# Patient Record
Sex: Female | Born: 1970 | Race: Black or African American | Hispanic: No | Marital: Single | Smoking: Former smoker
Health system: Southern US, Community
[De-identification: ages and names within clinical notes are randomized; demographics above are authoritative.]

## PROBLEM LIST (undated history)

## (undated) DIAGNOSIS — I10 Essential (primary) hypertension: Secondary | ICD-10-CM

## (undated) DIAGNOSIS — N760 Acute vaginitis: Secondary | ICD-10-CM

## (undated) DIAGNOSIS — B9689 Other specified bacterial agents as the cause of diseases classified elsewhere: Secondary | ICD-10-CM

## (undated) DIAGNOSIS — R739 Hyperglycemia, unspecified: Secondary | ICD-10-CM

## (undated) DIAGNOSIS — A6000 Herpesviral infection of urogenital system, unspecified: Secondary | ICD-10-CM

## (undated) HISTORY — DX: Herpesviral infection of urogenital system, unspecified: A60.00

## (undated) HISTORY — PX: TUBAL LIGATION: SHX77

## (undated) HISTORY — DX: Hyperglycemia, unspecified: R73.9

## (undated) HISTORY — DX: Essential (primary) hypertension: I10

---

## 2003-08-05 ENCOUNTER — Other Ambulatory Visit: Admission: RE | Admit: 2003-08-05 | Discharge: 2003-08-05 | Payer: Self-pay | Admitting: Family Medicine

## 2004-02-19 ENCOUNTER — Encounter: Admission: RE | Admit: 2004-02-19 | Discharge: 2004-02-19 | Payer: Self-pay | Admitting: *Deleted

## 2004-03-05 ENCOUNTER — Ambulatory Visit (HOSPITAL_COMMUNITY): Admission: RE | Admit: 2004-03-05 | Discharge: 2004-03-05 | Payer: Self-pay | Admitting: *Deleted

## 2004-04-26 ENCOUNTER — Emergency Department (HOSPITAL_COMMUNITY): Admission: EM | Admit: 2004-04-26 | Discharge: 2004-04-26 | Payer: Self-pay | Admitting: Emergency Medicine

## 2005-09-11 ENCOUNTER — Emergency Department (HOSPITAL_COMMUNITY): Admission: EM | Admit: 2005-09-11 | Discharge: 2005-09-11 | Payer: Self-pay | Admitting: Emergency Medicine

## 2007-06-09 ENCOUNTER — Emergency Department (HOSPITAL_COMMUNITY): Admission: EM | Admit: 2007-06-09 | Discharge: 2007-06-09 | Payer: Self-pay | Admitting: Emergency Medicine

## 2008-05-22 ENCOUNTER — Emergency Department (HOSPITAL_COMMUNITY): Admission: EM | Admit: 2008-05-22 | Discharge: 2008-05-22 | Payer: Self-pay | Admitting: *Deleted

## 2009-03-14 ENCOUNTER — Emergency Department (HOSPITAL_BASED_OUTPATIENT_CLINIC_OR_DEPARTMENT_OTHER): Admission: EM | Admit: 2009-03-14 | Discharge: 2009-03-14 | Payer: Self-pay | Admitting: Emergency Medicine

## 2009-08-30 ENCOUNTER — Emergency Department (HOSPITAL_BASED_OUTPATIENT_CLINIC_OR_DEPARTMENT_OTHER): Admission: EM | Admit: 2009-08-30 | Discharge: 2009-08-30 | Payer: Self-pay | Admitting: Emergency Medicine

## 2010-02-27 ENCOUNTER — Emergency Department (HOSPITAL_COMMUNITY)
Admission: EM | Admit: 2010-02-27 | Discharge: 2010-02-27 | Payer: Self-pay | Source: Home / Self Care | Admitting: Emergency Medicine

## 2010-04-21 ENCOUNTER — Ambulatory Visit (INDEPENDENT_AMBULATORY_CARE_PROVIDER_SITE_OTHER): Payer: 59 | Admitting: Gynecology

## 2010-04-21 ENCOUNTER — Other Ambulatory Visit: Payer: Self-pay | Admitting: Gynecology

## 2010-04-21 ENCOUNTER — Other Ambulatory Visit (HOSPITAL_COMMUNITY)
Admission: RE | Admit: 2010-04-21 | Discharge: 2010-04-21 | Disposition: A | Discharge status: Home / Self Care | Payer: 59 | Source: Ambulatory Visit | Attending: Gynecology | Admitting: Gynecology

## 2010-04-21 DIAGNOSIS — Z124 Encounter for screening for malignant neoplasm of cervix: Secondary | ICD-10-CM | POA: Insufficient documentation

## 2010-04-21 DIAGNOSIS — B373 Candidiasis of vulva and vagina: Secondary | ICD-10-CM

## 2010-04-21 DIAGNOSIS — Z01419 Encounter for gynecological examination (general) (routine) without abnormal findings: Secondary | ICD-10-CM

## 2010-04-21 DIAGNOSIS — Z113 Encounter for screening for infections with a predominantly sexual mode of transmission: Secondary | ICD-10-CM

## 2010-04-21 DIAGNOSIS — Z13220 Encounter for screening for lipoid disorders: Secondary | ICD-10-CM

## 2010-04-21 DIAGNOSIS — R82998 Other abnormal findings in urine: Secondary | ICD-10-CM

## 2010-04-21 DIAGNOSIS — Z833 Family history of diabetes mellitus: Secondary | ICD-10-CM

## 2010-04-21 DIAGNOSIS — R635 Abnormal weight gain: Secondary | ICD-10-CM

## 2010-05-17 LAB — URINALYSIS, ROUTINE W REFLEX MICROSCOPIC
Bilirubin Urine: NEGATIVE
Glucose, UA: NEGATIVE mg/dL
Ketones, ur: NEGATIVE mg/dL
Nitrite: NEGATIVE
Protein, ur: NEGATIVE mg/dL
Specific Gravity, Urine: 1.019 (ref 1.005–1.030)
Urobilinogen, UA: 1 mg/dL (ref 0.0–1.0)
pH: 6 (ref 5.0–8.0)

## 2010-05-17 LAB — GC/CHLAMYDIA PROBE AMP, GENITAL
Chlamydia, DNA Probe: NEGATIVE
GC Probe Amp, Genital: NEGATIVE

## 2010-05-17 LAB — WET PREP, GENITAL
Trich, Wet Prep: NONE SEEN
Yeast Wet Prep HPF POC: NONE SEEN

## 2010-05-17 LAB — URINE MICROSCOPIC-ADD ON

## 2010-05-17 LAB — POCT PREGNANCY, URINE: Preg Test, Ur: NEGATIVE

## 2010-05-23 LAB — URINALYSIS, ROUTINE W REFLEX MICROSCOPIC
Bilirubin Urine: NEGATIVE
Bilirubin Urine: NEGATIVE
Glucose, UA: NEGATIVE mg/dL
Glucose, UA: NEGATIVE mg/dL
Ketones, ur: NEGATIVE mg/dL
Ketones, ur: NEGATIVE mg/dL
Nitrite: NEGATIVE
Nitrite: NEGATIVE
Protein, ur: 30 mg/dL — AB
Protein, ur: NEGATIVE mg/dL
Specific Gravity, Urine: 1.022 (ref 1.005–1.030)
Specific Gravity, Urine: 1.023 (ref 1.005–1.030)
Urobilinogen, UA: 1 mg/dL (ref 0.0–1.0)
Urobilinogen, UA: 0.2 mg/dL (ref 0.0–1.0)
pH: 6.5 (ref 5.0–8.0)
pH: 6 (ref 5.0–8.0)

## 2010-05-23 LAB — PREGNANCY, URINE
Preg Test, Ur: NEGATIVE
Preg Test, Ur: NEGATIVE

## 2010-05-23 LAB — URINE CULTURE: Colony Count: 30000

## 2010-05-23 LAB — URINE MICROSCOPIC-ADD ON

## 2010-05-23 LAB — WET PREP, GENITAL
Trich, Wet Prep: NONE SEEN
Yeast Wet Prep HPF POC: NONE SEEN

## 2010-05-23 LAB — GC/CHLAMYDIA PROBE AMP, GENITAL
Chlamydia, DNA Probe: NEGATIVE
GC Probe Amp, Genital: NEGATIVE

## 2010-06-17 LAB — COMPREHENSIVE METABOLIC PANEL
ALT: 19 U/L (ref 0–35)
AST: 20 U/L (ref 0–37)
Albumin: 3.4 g/dL — ABNORMAL LOW (ref 3.5–5.2)
Alkaline Phosphatase: 64 U/L (ref 39–117)
BUN: 7 mg/dL (ref 6–23)
CO2: 25 mEq/L (ref 19–32)
Calcium: 8.6 mg/dL (ref 8.4–10.5)
Chloride: 109 mEq/L (ref 96–112)
Creatinine, Ser: 0.66 mg/dL (ref 0.4–1.2)
GFR calc Af Amer: >60 mL/min (ref >60)
GFR calc non Af Amer: >60 mL/min (ref >60)
Glucose, Bld: 95 mg/dL (ref 70–99)
Potassium: 3.9 mEq/L (ref 3.5–5.1)
Sodium: 138 mEq/L (ref 135–145)
Total Bilirubin: 0.4 mg/dL (ref 0.3–1.2)
Total Protein: 5.8 g/dL — ABNORMAL LOW (ref 6.0–8.3)

## 2010-06-17 LAB — WET PREP, GENITAL
Trich, Wet Prep: NONE SEEN
Yeast Wet Prep HPF POC: NONE SEEN

## 2010-06-17 LAB — DIFFERENTIAL
Basophils Absolute: 0 10*3/uL (ref 0.0–0.1)
Basophils Relative: 1 % (ref 0–1)
Eosinophils Absolute: 0.1 10*3/uL (ref 0.0–0.7)
Eosinophils Relative: 3 % (ref 0–5)
Lymphocytes Relative: 30 % (ref 12–46)
Lymphs Abs: 1.7 10*3/uL (ref 0.7–4.0)
Monocytes Absolute: 0.6 10*3/uL (ref 0.1–1.0)
Monocytes Relative: 11 % (ref 3–12)
Neutro Abs: 3.1 10*3/uL (ref 1.7–7.7)
Neutrophils Relative %: 56 % (ref 43–77)

## 2010-06-17 LAB — CBC
HCT: 36.7 % (ref 36.0–46.0)
Hemoglobin: 12.1 g/dL (ref 12.0–15.0)
MCHC: 32.9 g/dL (ref 30.0–36.0)
MCV: 85.7 fL (ref 78.0–100.0)
Platelets: 267 10*3/uL (ref 150–400)
RBC: 4.28 MIL/uL (ref 3.87–5.11)
RDW: 12.9 % (ref 11.5–15.5)
WBC: 5.5 10*3/uL (ref 4.0–10.5)

## 2010-06-17 LAB — URINALYSIS, ROUTINE W REFLEX MICROSCOPIC
Bilirubin Urine: NEGATIVE
Nitrite: NEGATIVE
Specific Gravity, Urine: 1.018 (ref 1.005–1.030)
Urobilinogen, UA: 1 mg/dL (ref 0.0–1.0)
pH: 6 (ref 5.0–8.0)

## 2010-06-17 LAB — GC/CHLAMYDIA PROBE AMP, GENITAL
Chlamydia, DNA Probe: NEGATIVE
GC Probe Amp, Genital: NEGATIVE

## 2010-06-17 LAB — URINE CULTURE

## 2010-06-17 LAB — POCT I-STAT, CHEM 8
BUN: 7 mg/dL (ref 6–23)
Calcium, Ion: 1.16 mmol/L (ref 1.12–1.32)
Chloride: 106 mEq/L (ref 96–112)
Creatinine, Ser: 0.7 mg/dL (ref 0.4–1.2)
Sodium: 141 mEq/L (ref 135–145)

## 2010-06-17 LAB — LIPASE, BLOOD: Lipase: 23 U/L (ref 11–59)

## 2010-06-17 LAB — POCT PREGNANCY, URINE: Preg Test, Ur: NEGATIVE

## 2010-07-07 ENCOUNTER — Institutional Professional Consult (permissible substitution): Payer: 59 | Admitting: Gynecology

## 2010-11-09 ENCOUNTER — Emergency Department (HOSPITAL_COMMUNITY): Payer: 59

## 2010-11-09 ENCOUNTER — Emergency Department (HOSPITAL_COMMUNITY)
Admission: EM | Admit: 2010-11-09 | Discharge: 2010-11-09 | Disposition: A | Discharge status: Home / Self Care | Payer: 59 | Attending: Emergency Medicine | Admitting: Emergency Medicine

## 2010-11-09 DIAGNOSIS — R1031 Right lower quadrant pain: Secondary | ICD-10-CM | POA: Insufficient documentation

## 2010-11-09 LAB — URINALYSIS, ROUTINE W REFLEX MICROSCOPIC
Protein, ur: NEGATIVE mg/dL
Urobilinogen, UA: 0.2 mg/dL (ref 0.0–1.0)

## 2010-11-09 LAB — CBC
MCH: 27.8 pg (ref 26.0–34.0)
MCHC: 32.8 g/dL (ref 30.0–36.0)
Platelets: 321 10*3/uL (ref 150–400)
RDW: 13 % (ref 11.5–15.5)

## 2010-11-09 LAB — DIFFERENTIAL
Basophils Relative: 0 % (ref 0–1)
Eosinophils Absolute: 0.2 10*3/uL (ref 0.0–0.7)
Eosinophils Relative: 2 % (ref 0–5)
Monocytes Absolute: 0.8 10*3/uL (ref 0.1–1.0)
Monocytes Relative: 9 % (ref 3–12)

## 2010-11-09 LAB — BASIC METABOLIC PANEL
Calcium: 9.1 mg/dL (ref 8.4–10.5)
GFR calc Af Amer: >60 mL/min (ref >60)
GFR calc non Af Amer: >60 mL/min (ref >60)
Glucose, Bld: 91 mg/dL (ref 70–99)
Sodium: 139 mEq/L (ref 135–145)

## 2010-11-09 LAB — URINE MICROSCOPIC-ADD ON

## 2010-11-09 MED ORDER — IOHEXOL 300 MG/ML  SOLN
100.0000 mL | Freq: Once | INTRAMUSCULAR | Status: AC | PRN
  Administered 2010-11-09: 100 mL via INTRAVENOUS

## 2010-11-10 LAB — RPR: RPR Ser Ql: NONREACTIVE

## 2010-11-22 ENCOUNTER — Encounter: Payer: Self-pay | Admitting: Anesthesiology

## 2010-11-23 ENCOUNTER — Encounter: Payer: Self-pay | Admitting: Gynecology

## 2010-11-23 ENCOUNTER — Ambulatory Visit (INDEPENDENT_AMBULATORY_CARE_PROVIDER_SITE_OTHER): Payer: 59 | Admitting: Gynecology

## 2010-11-23 VITALS — BP 122/82

## 2010-11-23 DIAGNOSIS — Z309 Encounter for contraceptive management, unspecified: Secondary | ICD-10-CM

## 2010-11-23 MED ORDER — MEDROXYPROGESTERONE ACETATE 150 MG/ML IM SUSP
150.0000 mg | Freq: Once | INTRAMUSCULAR | Status: AC
  Administered 2010-11-23: 150 mg via INTRAMUSCULAR

## 2010-11-23 NOTE — Progress Notes (Signed)
Patient a 40 year old gravida 3 para 2 AB 1 who presented to the office today to discuss about going back on the Depo-Provera injectable contraception. Review of her records indicated she was seen in the office on 04/21/2010 for her annual gynecological examination. She has a history of postpartum tubal ligation. Reason for her to go on the Depo-Provera so she will not have a period to cut down her bleeding and cramping. Patient is fully aware of the risks benefits and pros and cons of the Depo-Provera injectable contraception and 150 mg IM will be administered today and she'll return in 3 months as she was continuous her treatment regimen and is important that she consider taking calcium with vitamin D for osteoporosis prevention.

## 2010-11-30 LAB — URINALYSIS, ROUTINE W REFLEX MICROSCOPIC
Glucose, UA: NEGATIVE
Protein, ur: 30 — AB
Urobilinogen, UA: 0.2

## 2010-11-30 LAB — URINE MICROSCOPIC-ADD ON

## 2011-02-11 ENCOUNTER — Ambulatory Visit (INDEPENDENT_AMBULATORY_CARE_PROVIDER_SITE_OTHER): Payer: 59 | Admitting: *Deleted

## 2011-02-11 DIAGNOSIS — Z3049 Encounter for surveillance of other contraceptives: Secondary | ICD-10-CM

## 2011-02-11 MED ORDER — MEDROXYPROGESTERONE ACETATE 150 MG/ML IM SUSP
150.0000 mg | Freq: Once | INTRAMUSCULAR | Status: AC
  Administered 2011-02-11: 150 mg via INTRAMUSCULAR

## 2011-04-08 ENCOUNTER — Encounter (HOSPITAL_COMMUNITY): Payer: Self-pay | Admitting: Family Medicine

## 2011-04-08 ENCOUNTER — Emergency Department (HOSPITAL_COMMUNITY)
Admission: EM | Admit: 2011-04-08 | Discharge: 2011-04-08 | Disposition: A | Discharge status: Home / Self Care | Payer: 59 | Attending: Emergency Medicine | Admitting: Emergency Medicine

## 2011-04-08 DIAGNOSIS — H109 Unspecified conjunctivitis: Secondary | ICD-10-CM

## 2011-04-08 DIAGNOSIS — H5789 Other specified disorders of eye and adnexa: Secondary | ICD-10-CM | POA: Insufficient documentation

## 2011-04-08 MED ORDER — TETRACAINE HCL 0.5 % OP SOLN
2.0000 [drp] | Freq: Once | OPHTHALMIC | Status: AC
  Administered 2011-04-08: 2 [drp] via OPHTHALMIC
  Filled 2011-04-08: qty 2

## 2011-04-08 MED ORDER — FLUORESCEIN SODIUM 1 MG OP STRP
2.0000 | ORAL_STRIP | Freq: Once | OPHTHALMIC | Status: AC
  Administered 2011-04-08: 2 via OPHTHALMIC
  Filled 2011-04-08: qty 1

## 2011-04-08 MED ORDER — TOBRAMYCIN 0.3 % OP SOLN
2.0000 [drp] | OPHTHALMIC | Status: DC
  Administered 2011-04-08: 2 [drp] via OPHTHALMIC
  Filled 2011-04-08: qty 5

## 2011-04-08 NOTE — ED Provider Notes (Signed)
History     CSN: [id number]  Arrival date & time 04/08/11  0759   First MD Initiated Contact with Patient 04/08/11 0803      8:19 AM HPI Patient reports waking up with left eye mucous-like  and redness. Denies change in vision, photophobia, yellowish or green discharge matting eye together. Denies fever, headache, neck pain, eye injury or trauma.  Patient is a 41 y.o. female presenting with eye problem. The history is provided by the patient.  Eye Problem  This is a new problem. The problem occurs constantly. The problem has not changed since onset.There is pain in the left eye. The injury mechanism is unknown. The patient is experiencing no pain. There is no history of trauma to the eye. Associated symptoms include discharge and eye redness. Pertinent negatives include no blurred vision, no decreased vision, no double vision, no foreign body sensation, no photophobia, no nausea, no vomiting and no itching. She has tried eye drops for the symptoms. The treatment provided mild relief.    History reviewed. No pertinent past medical history.  Past Surgical History  Procedure Date  . Tubal ligation     PPTL    Family History  Problem Relation Age of Onset  . Hypertension Mother     History  Substance Use Topics  . Smoking status: Current Everyday Smoker    Types: Cigarettes  . Smokeless tobacco: Never Used  . Alcohol Use: No    OB History    Grav Para Term Preterm Abortions TAB SAB Ect Mult Living   3 2   1  1   2       Review of Systems  Constitutional: Negative for fever and chills.  HENT: Negative for sore throat and sneezing.   Eyes: Positive for discharge and redness. Negative for blurred vision, double vision, photophobia, pain, itching and visual disturbance.  Respiratory: Negative for cough, shortness of breath and wheezing.   Gastrointestinal: Negative for nausea and vomiting.  Skin: Negative for itching.  All other systems reviewed and are  negative.    Allergies  Review of patient's allergies indicates no known allergies.  Home Medications  No current outpatient prescriptions on file.  BP 136/79  Pulse 72  Temp(Src) 98.6 F (37 C) (Oral)  Resp 18  Ht 5\' 6"  (1.676 m)  Wt 190 lb (86.183 kg)  BMI 30.67 kg/m2  SpO2 99%  LMP 01/19/2011  Physical Exam  Vitals reviewed. Constitutional: She is oriented to person, place, and time. Vital signs are normal. She appears well-developed and well-nourished.  HENT:  Head: Normocephalic and atraumatic.  Eyes: EOM and lids are normal. Pupils are equal, round, and reactive to light. Left conjunctiva is injected. Right eye exhibits normal extraocular motion and no nystagmus. Left eye exhibits normal extraocular motion and no nystagmus.  Slit lamp exam:      The left eye shows no corneal abrasion, no corneal flare, no corneal ulcer and no fluorescein uptake.       Excessive tearing of the left eye  Neck: Normal range of motion. Neck supple.  Cardiovascular: Normal rate, regular rhythm and normal heart sounds.  Exam reveals no friction rub.   No murmur heard. Pulmonary/Chest: Effort normal and breath sounds normal. She has no wheezes. She has no rhonchi. She has no rales. She exhibits no tenderness.  Musculoskeletal: Normal range of motion.  Neurological: She is alert and oriented to person, place, and time. Coordination normal.  Skin: Skin is warm and dry. No rash  noted. No erythema. No pallor.    ED Course  Procedures  MDM   8:45 AM Neg fluorescein uptake.   Visual acuity: 20/20 (bilateral 20/20, left eye 20/25, very close to 20/20, right eye 20/20) ; R Near: 20/20 ; L Near: 20/25  Will treat as conjunctivitis. Advised she is contagious for at least 24 hours after starting abx eye drops.  Patient voices understanding and does not require a work note.     Thomasene Lot, PA-C 04/08/11 [phone]

## 2011-04-08 NOTE — ED Notes (Signed)
Pt reports woke up this morning with left eye sore and watering with redness. Denies injury. Denies vision problems.

## 2011-04-14 NOTE — ED Provider Notes (Signed)
Medical screening examination/treatment/procedure(s) were performed by non-physician practitioner and as supervising physician I was immediately available for consultation/collaboration.  Raeford Razor, MD 04/14/11 [phone]

## 2011-05-06 ENCOUNTER — Encounter: Payer: Self-pay | Admitting: Gynecology

## 2011-05-06 ENCOUNTER — Ambulatory Visit (INDEPENDENT_AMBULATORY_CARE_PROVIDER_SITE_OTHER): Payer: 59 | Admitting: Gynecology

## 2011-05-06 VITALS — BP 118/70 | Ht 66.0 in | Wt 187.0 lb

## 2011-05-06 DIAGNOSIS — Z01419 Encounter for gynecological examination (general) (routine) without abnormal findings: Secondary | ICD-10-CM

## 2011-05-06 DIAGNOSIS — Z0000 Encounter for general adult medical examination without abnormal findings: Secondary | ICD-10-CM

## 2011-05-06 DIAGNOSIS — Z3049 Encounter for surveillance of other contraceptives: Secondary | ICD-10-CM

## 2011-05-06 DIAGNOSIS — R635 Abnormal weight gain: Secondary | ICD-10-CM

## 2011-05-06 MED ORDER — MEDROXYPROGESTERONE ACETATE 150 MG/ML IM SUSP
150.0000 mg | Freq: Once | INTRAMUSCULAR | Status: AC
  Administered 2011-05-06: 150 mg via INTRAMUSCULAR

## 2011-05-06 MED ORDER — FLUCONAZOLE 150 MG PO TABS: 150.0000 mg | ORAL_TABLET | Freq: Once | ORAL | Status: AC

## 2011-05-06 NOTE — Patient Instructions (Signed)

## 2011-05-06 NOTE — Progress Notes (Signed)
Laurie Cervantes 11-Oct-1970 [id number]   History:    41 y.o.  for annual exam who brought to my attention that she's considering tubal reversal. Patient also on Depo-Provera injectable contraception every 3 months and she did for injection today. Patient is seeing  dermatologis for skin pigmentation. Last Pap smear was in 2012 normal. Screening mammogram age 64 was normal.   Past medical history,surgical history, family history and social history were all reviewed and documented in the EPIC chart.  Gynecologic History Patient's last menstrual period was 01/19/2011. Contraception: tubal ligation Last Pap: 2012. Results were: normal Last mammogram: 2005. Results were: normal  Obstetric History OB History    Grav Para Term Preterm Abortions TAB SAB Ect Mult Living   3 2 2  1  1   2      # Outc Date GA Lbr Len/2nd Wgt Sex Del Anes PTL Lv   1 TRM     M SVD  No Yes   2 TRM     M SVD  No Yes   3 SAB                ROS:  Was performed and pertinent positives and negatives are included in the history.  Exam: chaperone present  BP 118/70  Ht 5\' 6"  (1.676 m)  Wt 187 lb (84.823 kg)  BMI 30.18 kg/m2  LMP 01/19/2011  Body mass index is 30.18 kg/(m^2).  General appearance : Well developed well nourished female. No acute distress HEENT: Neck supple, trachea midline, no carotid bruits, no thyroidmegaly Lungs: Clear to auscultation, no rhonchi or wheezes, or rib retractions  Heart: Regular rate and rhythm, no murmurs or gallops Breast:Examined in sitting and supine position were symmetrical in appearance, no palpable masses or tenderness,  no skin retraction, no nipple inversion, no nipple discharge, no skin discoloration, no axillary or supraclavicular lymphadenopathy Abdomen: no palpable masses or tenderness, no rebound or guarding Extremities: no edema or skin discoloration or tenderness  Pelvic:  Bartholin, Urethra, Skene Glands: Within normal limits             Vagina: No gross lesions  or discharge  Cervix: No gross lesions or discharge  Uterus  anteverted, normal size, shape and consistency, non-tender and mobile  Adnexa  Without masses or tenderness  Anus and perineum  normal   Rectovaginal  normal sphincter tone without palpated masses or tenderness             Hemoccult not done     Assessment/Plan:  41 y.o. female  normal annual gynecological examination. Patient BMI 30. Patient was weight 193 down to 187. We'll check her TSH today along with a random blood sugar CBC cholesterol urinalysis. Pap smear not done today new screening guidelines were discussed. Pap smear will be done every 3 years. Patient was instructed to use probiotic gel to prevent recurrent BV or yeast. Prescription for Diflucan 150 mg was given to take as needed one tablet with 2 refills. Patient instructed take calcium and vitamin D for osteoporosis prevention. She was encouraged to continue to do her monthly self breast examination. She was given the name of reproductive endocrinologist Dr. Fermin Schwab to discuss in vitro fertilization instead of tubal reversal.   [name] MD, 5:37 PM 05/06/2011

## 2011-05-07 LAB — URINALYSIS W MICROSCOPIC + REFLEX CULTURE
Ketones, ur: NEGATIVE mg/dL
Nitrite: NEGATIVE
Protein, ur: NEGATIVE mg/dL
Urobilinogen, UA: 1 mg/dL (ref 0.0–1.0)

## 2011-05-09 ENCOUNTER — Other Ambulatory Visit: Payer: Self-pay | Admitting: *Deleted

## 2011-05-09 LAB — URINE CULTURE: Colony Count: >=100000

## 2011-05-09 MED ORDER — NITROFURANTOIN MONOHYD MACRO 100 MG PO CAPS: 100.0000 mg | ORAL_CAPSULE | Freq: Two times a day (BID) | ORAL | Status: AC

## 2011-07-28 ENCOUNTER — Ambulatory Visit (INDEPENDENT_AMBULATORY_CARE_PROVIDER_SITE_OTHER): Payer: 59 | Admitting: Anesthesiology

## 2011-07-28 DIAGNOSIS — Z309 Encounter for contraceptive management, unspecified: Secondary | ICD-10-CM

## 2011-07-28 MED ORDER — MEDROXYPROGESTERONE ACETATE 150 MG/ML IM SUSP
150.0000 mg | Freq: Once | INTRAMUSCULAR | Status: AC
  Administered 2011-07-28: 150 mg via INTRAMUSCULAR

## 2011-10-21 ENCOUNTER — Ambulatory Visit (INDEPENDENT_AMBULATORY_CARE_PROVIDER_SITE_OTHER): Payer: 59 | Admitting: Anesthesiology

## 2011-10-21 DIAGNOSIS — Z3040 Encounter for surveillance of contraceptives, unspecified: Secondary | ICD-10-CM

## 2011-10-21 DIAGNOSIS — Z309 Encounter for contraceptive management, unspecified: Secondary | ICD-10-CM

## 2011-10-21 MED ORDER — MEDROXYPROGESTERONE ACETATE 150 MG/ML IM SUSP
150.0000 mg | Freq: Once | INTRAMUSCULAR | Status: AC
  Administered 2011-10-21: 150 mg via INTRAMUSCULAR

## 2011-11-17 ENCOUNTER — Institutional Professional Consult (permissible substitution): Payer: 59 | Admitting: Gynecology

## 2011-11-26 ENCOUNTER — Encounter (HOSPITAL_COMMUNITY): Payer: Self-pay | Admitting: Family Medicine

## 2011-11-26 ENCOUNTER — Emergency Department (HOSPITAL_COMMUNITY)
Admission: EM | Admit: 2011-11-26 | Discharge: 2011-11-26 | Disposition: A | Discharge status: Home / Self Care | Payer: 59 | Attending: Emergency Medicine | Admitting: Emergency Medicine

## 2011-11-26 DIAGNOSIS — N390 Urinary tract infection, site not specified: Secondary | ICD-10-CM | POA: Insufficient documentation

## 2011-11-26 LAB — URINE MICROSCOPIC-ADD ON

## 2011-11-26 LAB — URINALYSIS, ROUTINE W REFLEX MICROSCOPIC
Ketones, ur: NEGATIVE mg/dL
Protein, ur: 30 mg/dL — AB
Urobilinogen, UA: 0.2 mg/dL (ref 0.0–1.0)

## 2011-11-26 MED ORDER — CIPROFLOXACIN HCL 500 MG PO TABS: 500.0000 mg | ORAL_TABLET | Freq: Two times a day (BID) | ORAL | Status: DC

## 2011-11-26 NOTE — ED Notes (Signed)
Pt alert and oriented x4. Respirations even and unlabored, bilateral symmetrical rise and fall of chest. Skin warm and dry. In no acute distress. Denies needs.   

## 2011-11-26 NOTE — ED Provider Notes (Signed)
Medical screening examination/treatment/procedure(s) were performed by non-physician practitioner and as supervising physician I was immediately available for consultation/collaboration.   Celene Kras, MD 11/26/11 [phone]

## 2011-11-26 NOTE — ED Notes (Signed)
Patient states that she has had lower abdominal pain and urinary urgency since yesterday. Denies vaginal discharge. States suprapubic pain.

## 2011-11-26 NOTE — ED Notes (Signed)
PA at bedside.

## 2011-11-26 NOTE — ED Provider Notes (Signed)
History     CSN: [id number]  Arrival date & time 11/26/11  0865   First MD Initiated Contact with Patient 11/26/11 0703      Chief Complaint  Patient presents with  . Urinary Frequency  . Abdominal Pain    (Consider location/radiation/quality/duration/timing/severity/associated sxs/prior treatment) HPI Comments: 41 year old female presents to the emergency department with increased urinary frequency beginning yesterday. Admits to associated suprapubic pain/pressure and urgency. Denies dysuria, hematuria, flank or back pain, fever, chills, nausea, vomiting, vaginal bleeding or discharge, menstrual problems. Had UTI's in the past which felt similar. States she has decreased her fluid intake to avoid the feeling of having to urinate.  Patient is a 41 y.o. female presenting with frequency and abdominal pain. The history is provided by the patient.  Urinary Frequency Associated symptoms include abdominal pain. Pertinent negatives include no chest pain, chills, fever, headaches, nausea or vomiting.  Abdominal Pain The primary symptoms of the illness include abdominal pain. The primary symptoms of the illness do not include fever, shortness of breath, nausea, vomiting, dysuria, vaginal discharge or vaginal bleeding.  Additional symptoms associated with the illness include urgency and frequency. Symptoms associated with the illness do not include chills, hematuria or back pain.    History reviewed. No pertinent past medical history.  Past Surgical History  Procedure Date  . Tubal ligation     PPTL  . Tubal ligation     Family History  Problem Relation Age of Onset  . Hypertension Mother     History  Substance Use Topics  . Smoking status: Never Smoker   . Smokeless tobacco: Never Used  . Alcohol Use: No    OB History    Grav Para Term Preterm Abortions TAB SAB Ect Mult Living   3 2 2  1  1   2       Review of Systems  Constitutional: Negative for fever and chills.    Respiratory: Negative for shortness of breath.   Cardiovascular: Negative for chest pain.  Gastrointestinal: Positive for abdominal pain. Negative for nausea and vomiting.  Genitourinary: Positive for urgency and frequency. Negative for dysuria, hematuria, flank pain, vaginal bleeding and vaginal discharge.  Musculoskeletal: Negative for back pain.  Skin: Negative for color change.  Neurological: Negative for headaches.    Allergies  Review of patient's allergies indicates no known allergies.  Home Medications   Current Outpatient Rx  Name Route Sig Dispense Refill  . MEDROXYPROGESTERONE ACETATE 150 MG/ML IM SUSP Intramuscular Inject 150 mg into the muscle every 3 (three) months.    Marland Kitchen NAPROXEN SODIUM 220 MG PO TABS Oral Take 220 mg by mouth 2 (two) times daily as needed. For pain.      BP 124/76  Pulse 76  Temp 98.6 F (37 C) (Oral)  Resp 18  SpO2 100%  Physical Exam  Nursing note and vitals reviewed. Constitutional: She is oriented to person, place, and time. She appears well-developed and well-nourished. No distress.  HENT:  Head: Normocephalic and atraumatic.  Mouth/Throat: Oropharynx is clear and moist.  Eyes: Conjunctivae normal are normal.  Neck: Normal range of motion. Neck supple.  Cardiovascular: Normal rate, regular rhythm and normal heart sounds.   Pulmonary/Chest: Effort normal and breath sounds normal.  Abdominal: Soft. Normal appearance and bowel sounds are normal. There is tenderness in the suprapubic area. There is no rigidity, no rebound, no guarding and no CVA tenderness.  Musculoskeletal: Normal range of motion.  Neurological: She is alert and oriented to person, place,  and time.  Skin: Skin is warm and dry. She is not diaphoretic.  Psychiatric: She has a normal mood and affect. Her behavior is normal.    ED Course  Procedures (including critical care time)   Labs Reviewed  URINALYSIS, ROUTINE W REFLEX MICROSCOPIC  URINE CULTURE   Results for  orders placed during the hospital encounter of 11/26/11  URINALYSIS, ROUTINE W REFLEX MICROSCOPIC      Component Value Range   Color, Urine AMBER (*) YELLOW   APPearance CLOUDY (*) CLEAR   Specific Gravity, Urine 1.027  1.005 - 1.030   pH 5.5  5.0 - 8.0   Glucose, UA NEGATIVE  NEGATIVE mg/dL   Hgb urine dipstick MODERATE (*) NEGATIVE   Bilirubin Urine NEGATIVE  NEGATIVE   Ketones, ur NEGATIVE  NEGATIVE mg/dL   Protein, ur 30 (*) NEGATIVE mg/dL   Urobilinogen, UA 0.2  0.0 - 1.0 mg/dL   Nitrite NEGATIVE  NEGATIVE   Leukocytes, UA MODERATE (*) NEGATIVE  URINE MICROSCOPIC-ADD ON      Component Value Range   Squamous Epithelial / LPF MANY (*) RARE   WBC, UA TOO NUMEROUS TO COUNT  <3 WBC/hpf   RBC / HPF 21-50  <3 RBC/hpf   Bacteria, UA FEW (*) RARE    No results found.   1. Urinary tract infection       MDM  41 y/o female with UTI. Will treat with cipro. Patient is afebrile, no CVA tenderness, and in NAD.        Trevor Mace, PA-C 11/26/11 [phone]

## 2011-11-26 NOTE — ED Notes (Signed)
Pt escorted to d/c window. In no acute distress.

## 2011-11-26 NOTE — ED Notes (Signed)
Assumed care of patient. Patient currently comfortably resting in her room. Denies any pain at this time. Drinking water to provide urine sample for analysis. Will continue to monitor.

## 2011-11-29 LAB — URINE CULTURE

## 2011-11-30 ENCOUNTER — Telehealth (HOSPITAL_COMMUNITY): Payer: Self-pay | Admitting: Emergency Medicine

## 2011-11-30 NOTE — ED Notes (Signed)
Results received from [facility].  URNC, >/= 100,000 colonies -> E Coli.  Rx given in ED for Cipro -> sensitive to the same.  Chart appended per protocol.  

## 2012-01-16 ENCOUNTER — Ambulatory Visit (INDEPENDENT_AMBULATORY_CARE_PROVIDER_SITE_OTHER): Payer: 59 | Admitting: Anesthesiology

## 2012-01-16 DIAGNOSIS — Z309 Encounter for contraceptive management, unspecified: Secondary | ICD-10-CM

## 2012-01-16 DIAGNOSIS — Z3040 Encounter for surveillance of contraceptives, unspecified: Secondary | ICD-10-CM

## 2012-01-16 MED ORDER — MEDROXYPROGESTERONE ACETATE 150 MG/ML IM SUSP
150.0000 mg | Freq: Once | INTRAMUSCULAR | Status: AC
  Administered 2012-01-16: 150 mg via INTRAMUSCULAR

## 2012-03-20 ENCOUNTER — Telehealth: Payer: Self-pay | Admitting: *Deleted

## 2012-03-20 NOTE — Telephone Encounter (Signed)
Recommend office visit with Dr. Lily Peer tomorrow morning. We could start Megace tonight 40 mg if she wants to do that or she could wait and see him in the morning. As it's been going on for 3 weeks I don't think one might would make a big difference. Let me know if she wants to try the Megace

## 2012-03-20 NOTE — Telephone Encounter (Signed)
Pt informed with the below note and she will make OV with JF.

## 2012-03-20 NOTE — Telephone Encounter (Signed)
(  JF patient) pt has been on depo provera since March 2013 and doing well until now. Pt said she started spotting around Christmas time and now c/o heavy bleeding off & on for about 3 weeks now.  Pt said clots are passing with bleeding,this would be patient first period while taking  I told her JF would want to see her and she is okay with this, but asked if anything could be given to help with bleeding? Please advise

## 2012-03-21 ENCOUNTER — Encounter: Payer: Self-pay | Admitting: Gynecology

## 2012-03-21 ENCOUNTER — Other Ambulatory Visit: Payer: Self-pay | Admitting: Gynecology

## 2012-03-21 ENCOUNTER — Telehealth: Payer: Self-pay | Admitting: Gynecology

## 2012-03-21 ENCOUNTER — Ambulatory Visit (INDEPENDENT_AMBULATORY_CARE_PROVIDER_SITE_OTHER): Payer: 59 | Admitting: Gynecology

## 2012-03-21 VITALS — BP 136/80

## 2012-03-21 DIAGNOSIS — N938 Other specified abnormal uterine and vaginal bleeding: Secondary | ICD-10-CM | POA: Insufficient documentation

## 2012-03-21 DIAGNOSIS — N949 Unspecified condition associated with female genital organs and menstrual cycle: Secondary | ICD-10-CM

## 2012-03-21 DIAGNOSIS — Z3049 Encounter for surveillance of other contraceptives: Secondary | ICD-10-CM

## 2012-03-21 LAB — PREGNANCY, URINE: Preg Test, Ur: NEGATIVE

## 2012-03-21 LAB — CBC WITH DIFFERENTIAL/PLATELET
Basophils Relative: 0 % (ref 0–1)
Eosinophils Absolute: 0.2 10*3/uL (ref 0.0–0.7)
Hemoglobin: 12.2 g/dL (ref 12.0–15.0)
MCH: 28 pg (ref 26.0–34.0)
MCHC: 34 g/dL (ref 30.0–36.0)
Monocytes Relative: 12 % (ref 3–12)
Neutrophils Relative %: 46 % (ref 43–77)

## 2012-03-21 MED ORDER — MEGESTROL ACETATE 40 MG PO TABS: 40.0000 mg | ORAL_TABLET | Freq: Two times a day (BID) | ORAL | Status: DC

## 2012-03-21 MED ORDER — LEVONORGESTREL 20 MCG/24HR IU IUD: INTRAUTERINE_SYSTEM | Freq: Once | INTRAUTERINE | Status: DC

## 2012-03-21 NOTE — Patient Instructions (Addendum)
Intrauterine Device Information  An intrauterine device (IUD) is inserted into your uterus and prevents pregnancy. There are 2 types of IUDs available:  · Copper IUD. This type of IUD is wrapped in copper wire and is placed inside the uterus. Copper makes the uterus and fallopian tubes produce a fluid that kills sperm. The copper IUD can stay in place for 10 years.  · Hormone IUD. This type of IUD contains the hormone progestin (synthetic progesterone). The hormone thickens the cervical mucus and prevents sperm from entering the uterus, and it also thins the uterine lining to prevent implantation of a fertilized egg. The hormone can weaken or kill the sperm that get into the uterus. The hormone IUD can stay in place for 5 years.  Your caregiver will make sure you are a good candidate for a contraceptive IUD. Discuss with your caregiver the possible side effects.  ADVANTAGES  · It is highly effective, reversible, long-acting, and low maintenance.  · There are no estrogen-related side effects.  · An IUD can be used when breastfeeding.  · It is not associated with weight gain.  · It works immediately after insertion.  · The copper IUD does not interfere with your female hormones.  · The progesterone IUD can make heavy menstrual periods lighter.  · The progesterone IUD can be used for 5 years.  · The copper IUD can be used for 10 years.  DISADVANTAGES  · The progesterone IUD can be associated with irregular bleeding patterns.  · The copper IUD can make your menstrual flow heavier and more painful.  · You may experience cramping and vaginal bleeding after insertion.  Document Released: 01/26/2004 Document Revised: 05/16/2011 Document Reviewed: 06/26/2010  Exit[employer]® Patient Information ©2013 [employer].

## 2012-03-21 NOTE — Progress Notes (Signed)
Patient is a 42 year old who was seen the office for her annual gynecological examination of March 2013. Patient with prior tubal sterilization procedure but is only on Depo-Provera injectable contraception every 3 months so that she would not have a period. She stated she is not had a menstrual cycle in over year and she is due for her next Depo-Provera injection the end of this month. She stated that since right before Christmas she's been bleeding on-and-off but no pain or cramping reported. Patient denies any weight changes or any new medications.  Exam: Abdomen soft nontender no rebound or guarding Pelvic: Bartholin urethra Skene was within normal limits Vagina: Dark brown blood in the vaginal vault was noted Cervix: No lesions or discharge Uterus: Upper limits of normal Adnexa no palpable masses or tenderness  Urine pregnancy test was negative  Assessment/plan: Breakthrough bleeding on Depo-Provera after one year of being amenorrheic. Patient has been compliant on receiving injections every 3 months. She will return back to the office next week for sonohysterogram and possible endometrial biopsy. The following labs were drawn today: TSH, prolactin, and CBC. She'll be placed on Megace 40 mg one by mouth twice a day for 10 days to stop her bleeding.

## 2012-03-21 NOTE — Telephone Encounter (Signed)
I advised pt today that her [employer] ins covers the Mirena & insertion at 100% under preventative services. She wants to proceed. She will call and schedule appt.WL

## 2012-03-28 ENCOUNTER — Other Ambulatory Visit: Payer: Self-pay | Admitting: Gynecology

## 2012-03-28 DIAGNOSIS — N938 Other specified abnormal uterine and vaginal bleeding: Secondary | ICD-10-CM

## 2012-03-30 ENCOUNTER — Other Ambulatory Visit: Payer: 59

## 2012-03-30 ENCOUNTER — Ambulatory Visit: Payer: 59 | Admitting: Gynecology

## 2012-03-30 ENCOUNTER — Ambulatory Visit (INDEPENDENT_AMBULATORY_CARE_PROVIDER_SITE_OTHER): Payer: 59 | Admitting: Gynecology

## 2012-03-30 ENCOUNTER — Ambulatory Visit (INDEPENDENT_AMBULATORY_CARE_PROVIDER_SITE_OTHER): Payer: 59

## 2012-03-30 DIAGNOSIS — N9089 Other specified noninflammatory disorders of vulva and perineum: Secondary | ICD-10-CM

## 2012-03-30 DIAGNOSIS — N949 Unspecified condition associated with female genital organs and menstrual cycle: Secondary | ICD-10-CM

## 2012-03-30 DIAGNOSIS — N938 Other specified abnormal uterine and vaginal bleeding: Secondary | ICD-10-CM

## 2012-03-30 NOTE — Patient Instructions (Signed)
Intrauterine Device Information  An intrauterine device (IUD) is inserted into your uterus and prevents pregnancy. There are 2 types of IUDs available:  · Copper IUD. This type of IUD is wrapped in copper wire and is placed inside the uterus. Copper makes the uterus and fallopian tubes produce a fluid that kills sperm. The copper IUD can stay in place for 10 years.  · Hormone IUD. This type of IUD contains the hormone progestin (synthetic progesterone). The hormone thickens the cervical mucus and prevents sperm from entering the uterus, and it also thins the uterine lining to prevent implantation of a fertilized egg. The hormone can weaken or kill the sperm that get into the uterus. The hormone IUD can stay in place for 5 years.  Your caregiver will make sure you are a good candidate for a contraceptive IUD. Discuss with your caregiver the possible side effects.  ADVANTAGES  · It is highly effective, reversible, long-acting, and low maintenance.  · There are no estrogen-related side effects.  · An IUD can be used when breastfeeding.  · It is not associated with weight gain.  · It works immediately after insertion.  · The copper IUD does not interfere with your female hormones.  · The progesterone IUD can make heavy menstrual periods lighter.  · The progesterone IUD can be used for 5 years.  · The copper IUD can be used for 10 years.  DISADVANTAGES  · The progesterone IUD can be associated with irregular bleeding patterns.  · The copper IUD can make your menstrual flow heavier and more painful.  · You may experience cramping and vaginal bleeding after insertion.  Document Released: 01/26/2004 Document Revised: 05/16/2011 Document Reviewed: 06/26/2010  ExitCare® Patient Information ©2013 [employer].

## 2012-03-31 NOTE — Progress Notes (Signed)
Patient presented to the office today for sonohysterogram. She was seen the office on 03/21/2012. Patient with prior tubal sterilization procedure but is only on Depo-Provera injectable contraception every 3 months so that she would not have a period. She stated she is not had a menstrual cycle in over year and she is due for her next Depo-Provera injection the end of this month. She stated that since right before Christmas she's been bleeding on-and-off but no pain or cramping reported. Patient is contemplating undergoing with a Mirena IUD. Her  Her sonohysterogram today as follows Uterus measured 11.0 x 6.5 x 5.9 cm with endometrial stripe of 3.6 mm. Patient was found to have several small intramural leiomyomas the largest one measuring 17 x 16 mm. Ovaries appeared to be normal. Endometrial stripe was 3.6 mm. There was no free fluid seen. Sonohysterogram otherwise normal.  Since she had a thin endometrial stripe and no intracavitary abnormalities noted an endometrial biopsy was not done. Of note at time of her pelvic exam she was noted to have a lesion on the right labia majora patient previously rotation from blood nevertheless the herpes culture was obtained resulting in some this dictation.  Patient will return back sometime next week for placement of Mirena IUD. Next week as when she would otherwise be due for her next Depo-Provera injection. Of note a few weeks ago her CBC, TSH and prolactin were normal. I suspect is irregular bleeding was attributed to wearing of fracture of her long-term use of Depo-Provera.

## 2012-04-04 ENCOUNTER — Ambulatory Visit (INDEPENDENT_AMBULATORY_CARE_PROVIDER_SITE_OTHER): Payer: 59 | Admitting: Gynecology

## 2012-04-04 ENCOUNTER — Encounter: Payer: Self-pay | Admitting: Gynecology

## 2012-04-04 VITALS — BP 146/92

## 2012-04-04 DIAGNOSIS — Z30430 Encounter for insertion of intrauterine contraceptive device: Secondary | ICD-10-CM

## 2012-04-04 NOTE — Patient Instructions (Signed)
Intrauterine Device Information  An intrauterine device (IUD) is inserted into your uterus and prevents pregnancy. There are 2 types of IUDs available:  · Copper IUD. This type of IUD is wrapped in copper wire and is placed inside the uterus. Copper makes the uterus and fallopian tubes produce a fluid that kills sperm. The copper IUD can stay in place for 10 years.  · Hormone IUD. This type of IUD contains the hormone progestin (synthetic progesterone). The hormone thickens the cervical mucus and prevents sperm from entering the uterus, and it also thins the uterine lining to prevent implantation of a fertilized egg. The hormone can weaken or kill the sperm that get into the uterus. The hormone IUD can stay in place for 5 years.  Your caregiver will make sure you are a good candidate for a contraceptive IUD. Discuss with your caregiver the possible side effects.  ADVANTAGES  · It is highly effective, reversible, long-acting, and low maintenance.  · There are no estrogen-related side effects.  · An IUD can be used when breastfeeding.  · It is not associated with weight gain.  · It works immediately after insertion.  · The copper IUD does not interfere with your female hormones.  · The progesterone IUD can make heavy menstrual periods lighter.  · The progesterone IUD can be used for 5 years.  · The copper IUD can be used for 10 years.  DISADVANTAGES  · The progesterone IUD can be associated with irregular bleeding patterns.  · The copper IUD can make your menstrual flow heavier and more painful.  · You may experience cramping and vaginal bleeding after insertion.  Document Released: 01/26/2004 Document Revised: 05/16/2011 Document Reviewed: 06/26/2010  ExitCare® Patient Information ©2013 [employer].

## 2012-04-04 NOTE — Progress Notes (Signed)
Patient presented to the office today for placement of Mirena IUD. She had been on Depo-Provera injectable contraception for her menorrhagia. She is now switching over to the Mirena IUD for which literature information was provided prior to today's visit. The risk benefits and pros and cons as well as failure rates were discussed. She is fully aware that this form of contraception is 99% effective and is good for 5 years. Patient was last seen the office on January 25 whereby she had a sonohysterogram since right before Christmas she was having irregular bleeding. Her sonohysterogram was as follows:  Her sonohysterogram 03/31/2012 as follows  Uterus measured 11.0 x 6.5 x 5.9 cm with endometrial stripe of 3.6 mm. Patient was found to have several small intramural leiomyomas the largest one measuring 17 x 16 mm. Ovaries appeared to be normal. Endometrial stripe was 3.6 mm. There was no free fluid seen. Sonohysterogram otherwise normal.  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 Result pending at time of this dictation.  Procedure note: Pelvic: Bartholin urethra Skene was within normal limits Vagina: No lesions or discharge Cervix: No lesions or discharge only old menstrual blood. Uterus: Upper limits of normal no palpable masses or tenderness Adnexa: No palpable masses or tenderness Rectal exam not done  The cervix was cleansed with Betadine solution. A single-tooth tenaculum was placed on the anterior cervical lip. The uterus sounded to 7 cm. The Mirena IUD was placed in sterile fashion and the string was trimmed. The single-tooth tenaculum was removed. The speculum was removed. Patient will return back in one month for followup.

## 2012-04-05 ENCOUNTER — Encounter: Payer: Self-pay | Admitting: Gynecology

## 2012-05-11 ENCOUNTER — Encounter: Payer: 59 | Admitting: Gynecology

## 2012-05-25 ENCOUNTER — Encounter: Payer: Self-pay | Admitting: Gynecology

## 2012-05-25 ENCOUNTER — Ambulatory Visit (INDEPENDENT_AMBULATORY_CARE_PROVIDER_SITE_OTHER): Payer: 59 | Admitting: Gynecology

## 2012-05-25 VITALS — BP 130/84 | Ht 66.0 in | Wt 189.0 lb

## 2012-05-25 DIAGNOSIS — Z01419 Encounter for gynecological examination (general) (routine) without abnormal findings: Secondary | ICD-10-CM

## 2012-05-25 DIAGNOSIS — Z23 Encounter for immunization: Secondary | ICD-10-CM

## 2012-05-25 NOTE — Patient Instructions (Addendum)

## 2012-05-25 NOTE — Progress Notes (Signed)
Laurie Cervantes 1970/04/27 [id number]   History:    42 y.o.  for annual gyn exam with no complaints only wanted to have the IUD string trimmed. Patient had a Mirena IUD for cycle control in January this year and has done well. She does have a prior tubal sterilization procedure. 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 She has not received her Tdap vaccine yet. She has always had normal Pap smears.  Past medical history,surgical history, family history and social history were all reviewed and documented in the EPIC chart.  Gynecologic History No LMP recorded. Patient is not currently having periods (Reason: IUD). Contraception: tubal ligation Last Pap: 2012. Results were: normal Last mammogram: not indicated. Results were: not indicated  Obstetric History OB History   Grav Para Term Preterm Abortions TAB SAB Ect Mult Living   3 2 2  1  1   2      # Outc Date GA Lbr Len/2nd Wgt Sex Del Anes PTL Lv   1 TRM     M SVD  No Yes   2 TRM     M SVD  No Yes   3 SAB                ROS: A ROS was performed and pertinent positives and negatives are included in the history.  GENERAL: No fevers or chills. HEENT: No change in vision, no earache, sore throat or sinus congestion. NECK: No pain or stiffness. CARDIOVASCULAR: No chest pain or pressure. No palpitations. PULMONARY: No shortness of breath, cough or wheeze. GASTROINTESTINAL: No abdominal pain, nausea, vomiting or diarrhea, melena or bright red blood per rectum. GENITOURINARY: No urinary frequency, urgency, hesitancy or dysuria. MUSCULOSKELETAL: No joint or muscle pain, no back pain, no recent trauma. DERMATOLOGIC: No rash, no itching, no lesions. ENDOCRINE: No polyuria, polydipsia, no heat or cold intolerance. No recent change in weight. HEMATOLOGICAL: No anemia or easy bruising or bleeding. NEUROLOGIC: No headache, seizures, numbness, tingling or weakness. PSYCHIATRIC: No depression, no loss of interest in  normal activity or change in sleep pattern.     Exam: chaperone present  BP 130/84  Ht 5\' 6"  (1.676 m)  Wt 189 lb (85.73 kg)  BMI 30.52 kg/m2  Body mass index is 30.52 kg/(m^2).  General appearance : Well developed well nourished female. No acute distress HEENT: Neck supple, trachea midline, no carotid bruits, no thyroidmegaly Lungs: Clear to auscultation, no rhonchi or wheezes, or rib retractions  Heart: Regular rate and rhythm, no murmurs or gallops Breast:Examined in sitting and supine position were symmetrical in appearance, no palpable masses or tenderness,  no skin retraction, no nipple inversion, no nipple discharge, no skin discoloration, no axillary or supraclavicular lymphadenopathy Abdomen: no palpable masses or tenderness, no rebound or guarding Extremities: no edema or skin discoloration or tenderness  Pelvic:  Bartholin, Urethra, Skene Glands: Within normal limits             Vagina: No gross lesions or discharge  Cervix: No gross lesions or discharge, IUD string barely seen  Uterus  anteverted, normal size, shape and consistency, non-tender and mobile  Adnexa  Without masses or tenderness  Anus and perineum  normal   Rectovaginal  normal sphincter tone without palpated masses or tenderness             Hemoccult that indicated     Assessment/Plan:  42 y.o. female for annual exam with no abnormalities noted.  Patient had a normal CBC, prolactin and TSH in January this year. We're going to check a screening cholesterol today at hemoglobin A1c. She will receive the Tdap vaccine today. No Pap smear done today the new guidelines discussed.    [name] MD, 4:38 PM 05/25/2012

## 2012-05-25 NOTE — Progress Notes (Signed)
Spotting only with iud 

## 2012-05-26 LAB — CHOLESTEROL, TOTAL: Cholesterol: 121 mg/dL (ref 0–200)

## 2012-05-26 LAB — HEMOGLOBIN A1C: Mean Plasma Glucose: 114 mg/dL (ref <117)

## 2012-09-26 ENCOUNTER — Other Ambulatory Visit: Payer: Self-pay

## 2012-09-26 DIAGNOSIS — Z1231 Encounter for screening mammogram for malignant neoplasm of breast: Secondary | ICD-10-CM

## 2012-10-26 ENCOUNTER — Ambulatory Visit: Payer: 59

## 2012-12-17 ENCOUNTER — Ambulatory Visit
Admission: RE | Admit: 2012-12-17 | Discharge: 2012-12-17 | Disposition: A | Discharge status: Home / Self Care | Payer: 59 | Source: Ambulatory Visit

## 2012-12-17 DIAGNOSIS — Z1231 Encounter for screening mammogram for malignant neoplasm of breast: Secondary | ICD-10-CM

## 2013-11-25 ENCOUNTER — Other Ambulatory Visit: Payer: Self-pay

## 2013-11-25 DIAGNOSIS — Z1231 Encounter for screening mammogram for malignant neoplasm of breast: Secondary | ICD-10-CM

## 2013-12-19 ENCOUNTER — Ambulatory Visit
Admission: RE | Admit: 2013-12-19 | Discharge: 2013-12-19 | Disposition: A | Discharge status: Home / Self Care | Payer: Managed Care, Other (non HMO) | Source: Ambulatory Visit

## 2013-12-19 ENCOUNTER — Encounter (INDEPENDENT_AMBULATORY_CARE_PROVIDER_SITE_OTHER): Payer: Self-pay

## 2013-12-19 ENCOUNTER — Ambulatory Visit: Payer: 59

## 2013-12-19 DIAGNOSIS — Z1231 Encounter for screening mammogram for malignant neoplasm of breast: Secondary | ICD-10-CM

## 2014-01-06 ENCOUNTER — Encounter: Payer: Self-pay | Admitting: Gynecology

## 2014-03-07 HISTORY — PX: HAND SURGERY: SHX662

## 2015-01-14 ENCOUNTER — Other Ambulatory Visit: Payer: Self-pay

## 2015-01-14 DIAGNOSIS — Z1231 Encounter for screening mammogram for malignant neoplasm of breast: Secondary | ICD-10-CM

## 2015-02-16 ENCOUNTER — Ambulatory Visit
Admission: RE | Admit: 2015-02-16 | Discharge: 2015-02-16 | Disposition: A | Discharge status: Home / Self Care | Payer: 59 | Source: Ambulatory Visit

## 2015-02-16 DIAGNOSIS — Z1231 Encounter for screening mammogram for malignant neoplasm of breast: Secondary | ICD-10-CM

## 2015-02-26 ENCOUNTER — Encounter (INDEPENDENT_AMBULATORY_CARE_PROVIDER_SITE_OTHER): Payer: 59 | Admitting: Podiatry

## 2015-02-26 NOTE — Progress Notes (Signed)
This encounter was created in error - please disregard.

## 2015-03-05 ENCOUNTER — Telehealth: Payer: Self-pay | Admitting: Podiatry

## 2015-03-05 NOTE — Telephone Encounter (Signed)
Left vm  For pt to call office to r/s appt

## 2015-06-23 ENCOUNTER — Encounter (HOSPITAL_COMMUNITY): Payer: Self-pay | Admitting: *Deleted

## 2015-06-23 ENCOUNTER — Emergency Department (HOSPITAL_COMMUNITY)
Admission: EM | Admit: 2015-06-23 | Discharge: 2015-06-24 | Disposition: A | Discharge status: Home / Self Care | Payer: 59 | Attending: Emergency Medicine | Admitting: Emergency Medicine

## 2015-06-23 DIAGNOSIS — Z3202 Encounter for pregnancy test, result negative: Secondary | ICD-10-CM | POA: Insufficient documentation

## 2015-06-23 DIAGNOSIS — N760 Acute vaginitis: Secondary | ICD-10-CM | POA: Insufficient documentation

## 2015-06-23 DIAGNOSIS — N898 Other specified noninflammatory disorders of vagina: Secondary | ICD-10-CM | POA: Diagnosis present

## 2015-06-23 DIAGNOSIS — Z79899 Other long term (current) drug therapy: Secondary | ICD-10-CM | POA: Diagnosis not present

## 2015-06-23 DIAGNOSIS — B9689 Other specified bacterial agents as the cause of diseases classified elsewhere: Secondary | ICD-10-CM

## 2015-06-23 HISTORY — DX: Acute vaginitis: N76.0

## 2015-06-23 HISTORY — DX: Other specified bacterial agents as the cause of diseases classified elsewhere: B96.89

## 2015-06-23 NOTE — ED Notes (Signed)
Patient is alert and oriented x4.  She is complaining of vaginal discharge with abdominal pain That started 4 days ago.  Patient states that she has had this issue before and is was Dx as Bacterial vaginosis.

## 2015-06-24 LAB — GC/CHLAMYDIA PROBE AMP (~~LOC~~) NOT AT ARMC
CHLAMYDIA, DNA PROBE: NEGATIVE
NEISSERIA GONORRHEA: NEGATIVE

## 2015-06-24 LAB — URINALYSIS, ROUTINE W REFLEX MICROSCOPIC
Bilirubin Urine: NEGATIVE
GLUCOSE, UA: NEGATIVE mg/dL
HGB URINE DIPSTICK: NEGATIVE
Ketones, ur: NEGATIVE mg/dL
LEUKOCYTES UA: NEGATIVE
Nitrite: NEGATIVE
PH: 5.5 (ref 5.0–8.0)
Protein, ur: NEGATIVE mg/dL
Specific Gravity, Urine: 1.021 (ref 1.005–1.030)

## 2015-06-24 LAB — WET PREP, GENITAL
SPERM: NONE SEEN
TRICH WET PREP: NONE SEEN
Yeast Wet Prep HPF POC: NONE SEEN

## 2015-06-24 LAB — PREGNANCY, URINE: Preg Test, Ur: NEGATIVE

## 2015-06-24 MED ORDER — METRONIDAZOLE 0.75 % VA GEL: 1.0000 | Freq: Two times a day (BID) | VAGINAL | Status: DC

## 2015-06-24 NOTE — Discharge Instructions (Signed)

## 2015-06-24 NOTE — ED Provider Notes (Signed)
CSN: [id number]     Arrival date & time 06/23/15  2240 History   First MD Initiated Contact with Patient 06/24/15 0115     Chief Complaint  Patient presents with  . Vaginal Discharge     (Consider location/radiation/quality/duration/timing/severity/associated sxs/prior Treatment) Patient is a 45 y.o. female presenting with vaginal discharge. The history is provided by the patient. No language interpreter was used.  Vaginal Discharge Associated symptoms: abdominal pain (mild, suprapubic)   Associated symptoms: no fever, no nausea and no vomiting    The patient recently completed a course of penicillin after toenail removal. She describes white malodorous vaginal discharge with mild lower abdominal pain over the last 3-4 days. She had a few episodes of diarrhea while taking the penicillin but she denies vaginal itching, nausea, vomiting, fever, or chills.  Past Medical History  Diagnosis Date  . Herpes genitalia     Type II Culture proven 03/2012  . Bacterial vaginosis    Past Surgical History  Procedure Laterality Date  . Tubal ligation      PPTL  . Tubal ligation     Family History  Problem Relation Age of Onset  . Hypertension Mother   . Breast cancer Maternal Aunt    Social History  Substance Use Topics  . Smoking status: Never Smoker   . Smokeless tobacco: Never Used  . Alcohol Use: No   OB History    Gravida Para Term Preterm AB TAB SAB Ectopic Multiple Living   3 2 2  1  1   2      Review of Systems  Constitutional: Negative for fever and chills.  Respiratory: Negative for shortness of breath.   Cardiovascular: Negative for chest pain.  Gastrointestinal: Positive for abdominal pain (mild, suprapubic) and diarrhea (now resolved). Negative for nausea, vomiting and blood in stool.  Genitourinary: Positive for vaginal discharge (white, malodorous).      Allergies  Review of patient's allergies indicates no known allergies.  Home Medications   Prior to  Admission medications   Medication Sig Start Date End Date Taking? Authorizing Provider  acetaminophen (TYLENOL) 500 MG tablet Take 100-1,500 mg by mouth every 6 (six) hours as needed for mild pain, moderate pain or headache.   Yes Historical Provider, MD  ibuprofen (ADVIL,MOTRIN) 200 MG tablet Take 400 mg by mouth every 6 (six) hours as needed for headache, mild pain or moderate pain.   Yes Historical Provider, MD  naproxen sodium (ANAPROX) 220 MG tablet Take 440 mg by mouth every 12 (twelve) hours as needed (pain).   Yes Historical Provider, MD  terbinafine (LAMISIL) 250 MG tablet Take 250 mg by mouth daily.   Yes Historical Provider, MD   BP 155/105 mmHg  Pulse 72  Temp(Src) 97.9 F (36.6 C) (Oral)  Resp 18  SpO2 100% Physical Exam  Constitutional: She is oriented to person, place, and time. She appears well-developed and well-nourished.  Cardiovascular: Normal rate and regular rhythm.   Pulmonary/Chest: Effort normal. She has no wheezes. She has no rales.  Abdominal: Soft. She exhibits no distension. There is tenderness (mild suprapubic). There is no rebound and no guarding.  Genitourinary: No bleeding in the vagina. Vaginal discharge (white) found.  No adnexal mass or tenderness. No CMT.   Neurological: She is alert and oriented to person, place, and time.  Psychiatric: She has a normal mood and affect.     ED Course  Procedures (including critical care time) Labs Review Labs Reviewed  WET PREP, GENITAL -  Abnormal; Notable for the following:    Clue Cells Wet Prep HPF POC PRESENT (*)    WBC, Wet Prep HPF POC FEW (*)    All other components within normal limits  URINALYSIS, ROUTINE W REFLEX MICROSCOPIC (NOT AT [facility]) - Abnormal; Notable for the following:    APPearance CLOUDY (*)    All other components within normal limits  PREGNANCY, URINE  GC/CHLAMYDIA PROBE AMP ([place]) NOT AT [facility]    Imaging Review No results found. I have personally reviewed and evaluated these  images and lab results as part of my medical decision-making.   EKG Interpretation None      MDM   Final diagnoses:  None    1. Bacterial Vaginosis  Discussed with patient plan to treat with Metrogel. Advised that GC/C test results will take 2 days to be completed, offered prophylactic treatment for Chlamydia and Gonorrhea. The patient would like to wait for final test results before initiating treatment. She is otherwise well appearing. Stable for discharge.   Charlann Lange, PA-C 06/27/15 [place], MD 06/27/15 [phone]

## 2016-01-27 ENCOUNTER — Encounter (INDEPENDENT_AMBULATORY_CARE_PROVIDER_SITE_OTHER): Payer: Self-pay

## 2016-01-27 ENCOUNTER — Ambulatory Visit (INDEPENDENT_AMBULATORY_CARE_PROVIDER_SITE_OTHER): Payer: 59 | Admitting: Orthopedic Surgery

## 2016-02-15 ENCOUNTER — Ambulatory Visit (INDEPENDENT_AMBULATORY_CARE_PROVIDER_SITE_OTHER): Payer: 59 | Admitting: Orthopedic Surgery

## 2016-02-15 ENCOUNTER — Encounter (INDEPENDENT_AMBULATORY_CARE_PROVIDER_SITE_OTHER): Payer: Self-pay | Admitting: Orthopedic Surgery

## 2016-02-15 DIAGNOSIS — G5601 Carpal tunnel syndrome, right upper limb: Secondary | ICD-10-CM

## 2016-02-15 NOTE — Progress Notes (Signed)
   Post-Op Visit Note   Patient: Laurie Cervantes           Date of Birth: 1970/12/06           MRN: [id number] Visit Date: 02/15/2016 PCP: No PCP Per Patient   Assessment & Plan:  Chief Complaint:  Chief Complaint  Patient presents with  . Right Hand - Pain, Injections   Visit Diagnoses:  1. Carpal tunnel syndrome on right     Plan:Laurie Cervantes is coming in today for right wrist injection for carpal tunnel syndrome.  She doesn't have her mother as a driver so she can come back next week for the injection.  No charge visit today.  Follow-Up Instructions: Return in about 1 week (around 02/22/2016).   Orders:  No orders of the defined types were placed in this encounter.  No orders of the defined types were placed in this encounter.    PMFS History: There are no active problems to display for this patient.  Past Medical History:  Diagnosis Date  . Bacterial vaginosis   . Herpes genitalia    Type II Culture proven 03/2012    Family History  Problem Relation Age of Onset  . Hypertension Mother   . Breast cancer Maternal Aunt     Past Surgical History:  Procedure Laterality Date  . TUBAL LIGATION     PPTL  . TUBAL LIGATION     Social History   Occupational History  . Not on file.   Social History Main Topics  . Smoking status: Never Smoker  . Smokeless tobacco: Never Used  . Alcohol use No  . Drug use: No  . Sexual activity: Yes

## 2016-02-22 ENCOUNTER — Ambulatory Visit (INDEPENDENT_AMBULATORY_CARE_PROVIDER_SITE_OTHER): Payer: 59 | Admitting: Orthopedic Surgery

## 2016-04-29 ENCOUNTER — Other Ambulatory Visit: Payer: Self-pay | Admitting: Gynecology

## 2016-04-29 DIAGNOSIS — Z1231 Encounter for screening mammogram for malignant neoplasm of breast: Secondary | ICD-10-CM

## 2016-05-23 ENCOUNTER — Ambulatory Visit
Admission: RE | Admit: 2016-05-23 | Discharge: 2016-05-23 | Disposition: A | Discharge status: Home / Self Care | Payer: 59 | Source: Ambulatory Visit | Attending: Gynecology | Admitting: Gynecology

## 2016-05-23 DIAGNOSIS — Z1231 Encounter for screening mammogram for malignant neoplasm of breast: Secondary | ICD-10-CM

## 2016-07-20 ENCOUNTER — Encounter: Payer: Self-pay | Admitting: Gynecology

## 2016-11-28 ENCOUNTER — Ambulatory Visit (INDEPENDENT_AMBULATORY_CARE_PROVIDER_SITE_OTHER): Payer: 59 | Admitting: Obstetrics & Gynecology

## 2016-11-28 ENCOUNTER — Ambulatory Visit (INDEPENDENT_AMBULATORY_CARE_PROVIDER_SITE_OTHER): Payer: 59

## 2016-11-28 ENCOUNTER — Encounter: Payer: Self-pay | Admitting: Obstetrics & Gynecology

## 2016-11-28 ENCOUNTER — Other Ambulatory Visit: Payer: Self-pay | Admitting: Obstetrics & Gynecology

## 2016-11-28 VITALS — BP 130/86 | Ht 66.0 in | Wt 223.0 lb

## 2016-11-28 DIAGNOSIS — T8332XA Displacement of intrauterine contraceptive device, initial encounter: Secondary | ICD-10-CM | POA: Diagnosis not present

## 2016-11-28 DIAGNOSIS — N852 Hypertrophy of uterus: Secondary | ICD-10-CM

## 2016-11-28 DIAGNOSIS — D251 Intramural leiomyoma of uterus: Secondary | ICD-10-CM

## 2016-11-28 DIAGNOSIS — Z9851 Tubal ligation status: Secondary | ICD-10-CM

## 2016-11-28 DIAGNOSIS — Z113 Encounter for screening for infections with a predominantly sexual mode of transmission: Secondary | ICD-10-CM | POA: Diagnosis not present

## 2016-11-28 DIAGNOSIS — Z30432 Encounter for removal of intrauterine contraceptive device: Secondary | ICD-10-CM | POA: Diagnosis not present

## 2016-11-28 DIAGNOSIS — N921 Excessive and frequent menstruation with irregular cycle: Secondary | ICD-10-CM | POA: Diagnosis not present

## 2016-11-28 DIAGNOSIS — Z01411 Encounter for gynecological examination (general) (routine) with abnormal findings: Secondary | ICD-10-CM | POA: Diagnosis not present

## 2016-11-28 DIAGNOSIS — Z01419 Encounter for gynecological examination (general) (routine) without abnormal findings: Secondary | ICD-10-CM

## 2016-11-28 DIAGNOSIS — Z1151 Encounter for screening for human papillomavirus (HPV): Secondary | ICD-10-CM

## 2016-11-28 MED ORDER — MEDROXYPROGESTERONE ACETATE 150 MG/ML IM SUSP
150.0000 mg | Freq: Once | INTRAMUSCULAR | Status: AC
  Administered 2016-11-28: 150 mg via INTRAMUSCULAR

## 2016-11-28 NOTE — Progress Notes (Signed)
Laurie Cervantes Jun 16, 1970 [id number]   History:    46 y.o.  G3P2A1L2 Single.  S/P BT/S  RP:  New patient presenting for annual gyn exam (Last time seen here 2014)  HPI:  Metrorrhagia on Mirena IUD, would like to remove it and start DepoProvera for cycle control.  Abstinent currently, last IC 04/2016.  No pelvic pain.  Breasts wnl.  Mictions/BMs wnl.  Past medical history,surgical history, family history and social history were all reviewed and documented in the EPIC chart.  Gynecologic History No LMP recorded. Patient is not currently having periods (Reason: IUD). Contraception: tubal ligation Last Pap: 04/2010. Results were: normal Last mammogram: 2018. Results were: normal  Obstetric History OB History  Gravida Para Term Preterm AB Living  3 2 2   1 2   SAB TAB Ectopic Multiple Live Births  1       2    # Outcome Date GA Lbr Len/2nd Weight Sex Delivery Anes PTL Lv  3 SAB           2 Term     M Vag-Spont  N LIV  1 Term     M Vag-Spont  N LIV       ROS: A ROS was performed and pertinent positives and negatives are included in the history.  GENERAL: No fevers or chills. HEENT: No change in vision, no earache, sore throat or sinus congestion. NECK: No pain or stiffness. CARDIOVASCULAR: No chest pain or pressure. No palpitations. PULMONARY: No shortness of breath, cough or wheeze. GASTROINTESTINAL: No abdominal pain, nausea, vomiting or diarrhea, melena or bright red blood per rectum. GENITOURINARY: No urinary frequency, urgency, hesitancy or dysuria. MUSCULOSKELETAL: No joint or muscle pain, no back pain, no recent trauma. DERMATOLOGIC: No rash, no itching, no lesions. ENDOCRINE: No polyuria, polydipsia, no heat or cold intolerance. No recent change in weight. HEMATOLOGICAL: No anemia or easy bruising or bleeding. NEUROLOGIC: No headache, seizures, numbness, tingling or weakness. PSYCHIATRIC: No depression, no loss of interest in normal activity or change in sleep pattern.      Exam:   BP 130/86   Ht 5\' 6"  (1.676 m)   Wt 223 lb (101.2 kg)   BMI 35.99 kg/m   Body mass index is 35.99 kg/m.  General appearance : Well developed well nourished female. No acute distress HEENT: Eyes: no retinal hemorrhage or exudates,  Neck supple, trachea midline, no carotid bruits, no thyroidmegaly Lungs: Clear to auscultation, no rhonchi or wheezes, or rib retractions  Heart: Regular rate and rhythm, no murmurs or gallops Breast:Examined in sitting and supine position were symmetrical in appearance, no palpable masses or tenderness,  no skin retraction, no nipple inversion, no nipple discharge, no skin discoloration, no axillary or supraclavicular lymphadenopathy Abdomen: no palpable masses or tenderness, no rebound or guarding Extremities: no edema or skin discoloration or tenderness  Pelvic: Vulva normal  Bartholin, Urethra, Skene Glands: Within normal limits             Vagina: No gross lesions or discharge  Cervix: No gross lesions or discharge.  Strings not visible.  Pap/HPV HR/Gono-Chlam done.  Attempted removal of IUD with narrow clamp in Endocervical canal without success.  Uterus  AV, normal size, shape and consistency, non-tender and mobile  Adnexa  Without masses or tenderness  Anus and perineum  normal   Pelvic US today:  T/V and T/A Anteverted enlarged uterus at 13.37 x 7.18 x 8.62 cm with intramural fibroid right posterior at 7.4 x  6.3 x 4.9 cm displacing the endometrial cavity to the left.  IUD seen in normal intrauterine position.  Right ovary normal.  Left ovary normal with a thin-walled echo-free follicle 2.2 x 2.5 x 1.7 cm.  No FF in CDS.   IUD removal successful under US guidance after Betadine prep and Hurricane spray on cervix.  IUD intact, shown to patient.   Assessment/Plan:  46 y.o. female for annual exam   1. Encounter for routine gynecological examination with Papanicolaou smear of cervix Gyn exam with Fibroid uterus.  Pap/HPV done.  Breast  exam normal. - Pap IG, CT/NG NAA, and HPV (high risk)  2. Tubal ligation status   3. Encounter for removal of intrauterine contraceptive device (IUD) Successful removal of IUD under US guidance because strings were not visible.  4. Metrorrhagia Cycle control with DepoProvera, starting today.  No CI to DepoProvera.  Used before with success and good tolerance. - medroxyPROGESTERone (DEPO-PROVERA) injection 150 mg; Inject 1 mL (150 mg total) into the muscle once.  5. Intrauterine contraceptive device threads lost, initial encounter Removed successfully under US guidance. - US Transvaginal Non-OB  6. Screen for STD (sexually transmitted disease) Condom use strongly recommended. - HIV antibody - RPR - Hepatitis B Surface AntiGEN - Hepatitis C Antibody - Pap IG, CT/NG NAA, and HPV (high risk)  7. Special screening examination for human papillomavirus (HPV)  - Pap IG, CT/NG NAA, and HPV (high risk)  Counseling on above issues >50% x 10 minutes. Princess Bruins MD, 3:53 PM 11/28/2016

## 2016-11-29 LAB — PAP IG, CT-NG NAA, HPV HIGH-RISK
C. TRACHOMATIS RNA, TMA: NOT DETECTED
HPV DNA HIGH RISK: NOT DETECTED
N. gonorrhoeae RNA, TMA: NOT DETECTED

## 2016-11-29 LAB — HIV ANTIBODY (ROUTINE TESTING W REFLEX): HIV: NONREACTIVE

## 2016-11-29 LAB — RPR: RPR Ser Ql: NONREACTIVE

## 2016-11-29 LAB — HEPATITIS C ANTIBODY
HEP C AB: NONREACTIVE
SIGNAL TO CUT-OFF: 0.08 (ref <1.00)

## 2016-11-29 LAB — HEPATITIS B SURFACE ANTIGEN: HEP B S AG: NONREACTIVE

## 2016-12-03 NOTE — Patient Instructions (Signed)
1. Encounter for routine gynecological examination with Papanicolaou smear of cervix Gyn exam with Fibroid uterus.  Pap/HPV done.  Breast exam normal. - Pap IG, CT/NG NAA, and HPV (high risk)  2. Tubal ligation status   3. Encounter for removal of intrauterine contraceptive device (IUD) Successful removal of IUD under US guidance because strings were not visible.  4. Metrorrhagia Cycle control with DepoProvera, starting today.  No CI to DepoProvera.  Used before with success and good tolerance. - medroxyPROGESTERone (DEPO-PROVERA) injection 150 mg; Inject 1 mL (150 mg total) into the muscle once.  5. Intrauterine contraceptive device threads lost, initial encounter Removed successfully under US guidance. - US Transvaginal Non-OB  6. Screen for STD (sexually transmitted disease) Condom use strongly recommended. - HIV antibody - RPR - Hepatitis B Surface AntiGEN - Hepatitis C Antibody - Pap IG, CT/NG NAA, and HPV (high risk)  7. Special screening examination for human papillomavirus (HPV)  - Pap IG, CT/NG NAA, and HPV (high risk)  Arleth, it was a pleasure to meet you today!  I will inform you of your results as soon as available.  Health Maintenance, Female Adopting a healthy lifestyle and getting preventive care can go a long way to promote health and wellness. Talk with your health care provider about what schedule of regular examinations is right for you. This is a good chance for you to check in with your provider about disease prevention and staying healthy. In between checkups, there are plenty of things you can do on your own. Experts have done a lot of research about which lifestyle changes and preventive measures are most likely to keep you healthy. Ask your health care provider for more information. Weight and diet Eat a healthy diet  Be sure to include plenty of vegetables, fruits, low-fat dairy products, and lean protein.  Do not eat a lot of foods high in solid fats,  added sugars, or salt.  Get regular exercise. This is one of the most important things you can do for your health. ? Most adults should exercise for at least 150 minutes each week. The exercise should increase your heart rate and make you sweat (moderate-intensity exercise). ? Most adults should also do strengthening exercises at least twice a week. This is in addition to the moderate-intensity exercise.  Maintain a healthy weight  Body mass index (BMI) is a measurement that can be used to identify possible weight problems. It estimates body fat based on height and weight. Your health care provider can help determine your BMI and help you achieve or maintain a healthy weight.  For females 19 years of age and older: ? A BMI below 18.5 is considered underweight. ? A BMI of 18.5 to 24.9 is normal. ? A BMI of 25 to 29.9 is considered overweight. ? A BMI of 30 and above is considered obese.  Watch levels of cholesterol and blood lipids  You should start having your blood tested for lipids and cholesterol at 44 years of age, then have this test every 5 years.  You may need to have your cholesterol levels checked more often if: ? Your lipid or cholesterol levels are high. ? You are older than 44 years of age. ? You are at high risk for heart disease.  Cancer screening Lung Cancer  Lung cancer screening is recommended for adults 35-42 years old who are at high risk for lung cancer because of a history of smoking.  A yearly low-dose CT scan of the lungs  is recommended for people who: ? Currently smoke. ? Have quit within the past 15 years. ? Have at least a 30-pack-year history of smoking. A pack year is smoking an average of one pack of cigarettes a day for 1 year.  Yearly screening should continue until it has been 15 years since you quit.  Yearly screening should stop if you develop a health problem that would prevent you from having lung cancer treatment.  Breast Cancer  Practice  breast self-awareness. This means understanding how your breasts normally appear and feel.  It also means doing regular breast self-exams. Let your health care provider know about any changes, no matter how small.  If you are in your 20s or 30s, you should have a clinical breast exam (CBE) by a health care provider every 1-3 years as part of a regular health exam.  If you are 73 or older, have a CBE every year. Also consider having a breast X-ray (mammogram) every year.  If you have a family history of breast cancer, talk to your health care provider about genetic screening.  If you are at high risk for breast cancer, talk to your health care provider about having an MRI and a mammogram every year.  Breast cancer gene (BRCA) assessment is recommended for women who have family members with BRCA-related cancers. BRCA-related cancers include: ? Breast. ? Ovarian. ? Tubal. ? Peritoneal cancers.  Results of the assessment will determine the need for genetic counseling and BRCA1 and BRCA2 testing.  Cervical Cancer Your health care provider may recommend that you be screened regularly for cancer of the pelvic organs (ovaries, uterus, and vagina). This screening involves a pelvic examination, including checking for microscopic changes to the surface of your cervix (Pap test). You may be encouraged to have this screening done every 3 years, beginning at age 43.  For women ages 25-65, health care providers may recommend pelvic exams and Pap testing every 3 years, or they may recommend the Pap and pelvic exam, combined with testing for human papilloma virus (HPV), every 5 years. Some types of HPV increase your risk of cervical cancer. Testing for HPV may also be done on women of any age with unclear Pap test results.  Other health care providers may not recommend any screening for nonpregnant women who are considered low risk for pelvic cancer and who do not have symptoms. Ask your health care provider  if a screening pelvic exam is right for you.  If you have had past treatment for cervical cancer or a condition that could lead to cancer, you need Pap tests and screening for cancer for at least 20 years after your treatment. If Pap tests have been discontinued, your risk factors (such as having a new sexual partner) need to be reassessed to determine if screening should resume. Some women have medical problems that increase the chance of getting cervical cancer. In these cases, your health care provider may recommend more frequent screening and Pap tests.  Colorectal Cancer  This type of cancer can be detected and often prevented.  Routine colorectal cancer screening usually begins at 44 years of age and continues through 44 years of age.  Your health care provider may recommend screening at an earlier age if you have risk factors for colon cancer.  Your health care provider may also recommend using home test kits to check for hidden blood in the stool.  A small camera at the end of a tube can be used to examine  your colon directly (sigmoidoscopy or colonoscopy). This is done to check for the earliest forms of colorectal cancer.  Routine screening usually begins at age 50.  Direct examination of the colon should be repeated every 5-10 years through 44 years of age. However, you may need to be screened more often if early forms of precancerous polyps or small growths are found.  Skin Cancer  Check your skin from head to toe regularly.  Tell your health care provider about any new moles or changes in moles, especially if there is a change in a mole's shape or color.  Also tell your health care provider if you have a mole that is larger than the size of a pencil eraser.  Always use sunscreen. Apply sunscreen liberally and repeatedly throughout the day.  Protect yourself by wearing long sleeves, pants, a wide-brimmed hat, and sunglasses whenever you are outside.  Heart disease,  diabetes, and high blood pressure  High blood pressure causes heart disease and increases the risk of stroke. High blood pressure is more likely to develop in: ? People who have blood pressure in the high end of the normal range (130-139/85-89 mm Hg). ? People who are overweight or obese. ? People who are African American.  If you are 18-39 years of age, have your blood pressure checked every 3-5 years. If you are 40 years of age or older, have your blood pressure checked every year. You should have your blood pressure measured twice-once when you are at a hospital or clinic, and once when you are not at a hospital or clinic. Record the average of the two measurements. To check your blood pressure when you are not at a hospital or clinic, you can use: ? An automated blood pressure machine at a pharmacy. ? A home blood pressure monitor.  If you are between 55 years and 79 years old, ask your health care provider if you should take aspirin to prevent strokes.  Have regular diabetes screenings. This involves taking a blood sample to check your fasting blood sugar level. ? If you are at a normal weight and have a low risk for diabetes, have this test once every three years after 45 years of age. ? If you are overweight and have a high risk for diabetes, consider being tested at a younger age or more often. Preventing infection Hepatitis B  If you have a higher risk for hepatitis B, you should be screened for this virus. You are considered at high risk for hepatitis B if: ? You were born in a country where hepatitis B is common. Ask your health care provider which countries are considered high risk. ? Your parents were born in a high-risk country, and you have not been immunized against hepatitis B (hepatitis B vaccine). ? You have HIV or AIDS. ? You use needles to inject street drugs. ? You live with someone who has hepatitis B. ? You have had sex with someone who has hepatitis B. ? You get  hemodialysis treatment. ? You take certain medicines for conditions, including cancer, organ transplantation, and autoimmune conditions.  Hepatitis C  Blood testing is recommended for: ? Everyone born from 1945 through 1965. ? Anyone with known risk factors for hepatitis C.  Sexually transmitted infections (STIs)  You should be screened for sexually transmitted infections (STIs) including gonorrhea and chlamydia if: ? You are sexually active and are younger than 44 years of age. ? You are older than 44 years of age and your   health care provider tells you that you are at risk for this type of infection. ? Your sexual activity has changed since you were last screened and you are at an increased risk for chlamydia or gonorrhea. Ask your health care provider if you are at risk.  If you do not have HIV, but are at risk, it may be recommended that you take a prescription medicine daily to prevent HIV infection. This is called pre-exposure prophylaxis (PrEP). You are considered at risk if: ? You are sexually active and do not regularly use condoms or know the HIV status of your partner(s). ? You take drugs by injection. ? You are sexually active with a partner who has HIV.  Talk with your health care provider about whether you are at high risk of being infected with HIV. If you choose to begin PrEP, you should first be tested for HIV. You should then be tested every 3 months for as long as you are taking PrEP. Pregnancy  If you are premenopausal and you may become pregnant, ask your health care provider about preconception counseling.  If you may become pregnant, take 400 to 800 micrograms (mcg) of folic acid every day.  If you want to prevent pregnancy, talk to your health care provider about birth control (contraception). Osteoporosis and menopause  Osteoporosis is a disease in which the bones lose minerals and strength with aging. This can result in serious bone fractures. Your risk for  osteoporosis can be identified using a bone density scan.  If you are 72 years of age or older, or if you are at risk for osteoporosis and fractures, ask your health care provider if you should be screened.  Ask your health care provider whether you should take a calcium or vitamin D supplement to lower your risk for osteoporosis.  Menopause may have certain physical symptoms and risks.  Hormone replacement therapy may reduce some of these symptoms and risks. Talk to your health care provider about whether hormone replacement therapy is right for you. Follow these instructions at home:  Schedule regular health, dental, and eye exams.  Stay current with your immunizations.  Do not use any tobacco products including cigarettes, chewing tobacco, or electronic cigarettes.  If you are pregnant, do not drink alcohol.  If you are breastfeeding, limit how much and how often you drink alcohol.  Limit alcohol intake to no more than 1 drink per day for nonpregnant women. One drink equals 12 ounces of beer, 5 ounces of wine, or 1 ounces of hard liquor.  Do not use street drugs.  Do not share needles.  Ask your health care provider for help if you need support or information about quitting drugs.  Tell your health care provider if you often feel depressed.  Tell your health care provider if you have ever been abused or do not feel safe at home. This information is not intended to replace advice given to you by your health care provider. Make sure you discuss any questions you have with your health care provider. Document Released: 09/06/2010 Document Revised: 07/30/2015 Document Reviewed: 11/25/2014 Elsevier Interactive Patient Education  [employer].

## 2016-12-12 ENCOUNTER — Telehealth: Payer: Self-pay | Admitting: *Deleted

## 2016-12-12 NOTE — Telephone Encounter (Signed)
Pt was seen on 11/28/16 removal IUD, had depo provera injection this day as well. Pt said she has been bleeding since 11/30/16, not heavy, changing pads every hours, no pain. I told pt not abnormal to have irregular bleeding after IUD removal/depo provera. But I would run this information by you as well.

## 2016-12-18 NOTE — Telephone Encounter (Signed)
Recommend observation until 2nd dose of DepoProvera if mild spotting only.  Can advance the 2nd dose of DepoProvera at anytime now if heavier bleeding.

## 2016-12-19 NOTE — Telephone Encounter (Signed)
Pt aware, states no bleeding in 2 days now.

## 2017-02-13 ENCOUNTER — Ambulatory Visit: Payer: 59

## 2017-02-16 ENCOUNTER — Ambulatory Visit: Payer: 59

## 2017-02-20 ENCOUNTER — Ambulatory Visit (INDEPENDENT_AMBULATORY_CARE_PROVIDER_SITE_OTHER): Payer: 59 | Admitting: Anesthesiology

## 2017-02-20 DIAGNOSIS — N921 Excessive and frequent menstruation with irregular cycle: Secondary | ICD-10-CM

## 2017-02-20 MED ORDER — MEDROXYPROGESTERONE ACETATE 150 MG/ML IM SUSP
150.0000 mg | Freq: Once | INTRAMUSCULAR | Status: AC
  Administered 2017-02-20: 150 mg via INTRAMUSCULAR

## 2017-05-02 ENCOUNTER — Other Ambulatory Visit: Payer: Self-pay | Admitting: Obstetrics & Gynecology

## 2017-05-02 DIAGNOSIS — Z1231 Encounter for screening mammogram for malignant neoplasm of breast: Secondary | ICD-10-CM

## 2017-05-15 ENCOUNTER — Telehealth: Payer: Self-pay | Admitting: *Deleted

## 2017-05-15 MED ORDER — MEDROXYPROGESTERONE ACETATE 150 MG/ML IM SUSP: 150.0000 mg | Freq: Once | INTRAMUSCULAR | 2 refills | Status: DC

## 2017-05-15 NOTE — Telephone Encounter (Signed)
-----   Message from Sinclair Grooms sent at 05/15/2017  2:58 PM EDT ----- Regarding: depo inj Patient coming in for depo inj tomorrow needs Rx called in to [facility]. Thx

## 2017-05-15 NOTE — Telephone Encounter (Signed)
Rx sent 

## 2017-05-16 ENCOUNTER — Ambulatory Visit (INDEPENDENT_AMBULATORY_CARE_PROVIDER_SITE_OTHER): Payer: 59 | Admitting: Anesthesiology

## 2017-05-16 DIAGNOSIS — N921 Excessive and frequent menstruation with irregular cycle: Secondary | ICD-10-CM

## 2017-05-16 MED ORDER — MEDROXYPROGESTERONE ACETATE 150 MG/ML IM SUSP
150.0000 mg | Freq: Once | INTRAMUSCULAR | Status: AC
  Administered 2017-05-16: 150 mg via INTRAMUSCULAR

## 2017-05-29 ENCOUNTER — Ambulatory Visit
Admission: RE | Admit: 2017-05-29 | Discharge: 2017-05-29 | Disposition: A | Discharge status: Home / Self Care | Payer: 59 | Source: Ambulatory Visit | Attending: Obstetrics & Gynecology | Admitting: Obstetrics & Gynecology

## 2017-05-29 DIAGNOSIS — Z1231 Encounter for screening mammogram for malignant neoplasm of breast: Secondary | ICD-10-CM

## 2017-08-09 ENCOUNTER — Telehealth: Payer: Self-pay | Admitting: *Deleted

## 2017-08-09 MED ORDER — MEDROXYPROGESTERONE ACETATE 150 MG/ML IM SUSP: 150.0000 mg | Freq: Once | INTRAMUSCULAR | 1 refills | Status: DC

## 2017-08-09 NOTE — Telephone Encounter (Signed)
-----   Message from Sinclair Grooms sent at 08/09/2017 12:16 PM EDT ----- Regarding: RX Please call medroxyPROGESTERone (DEPO-PROVERA) 150 MG/ML injection [[id number]] to [facility], [place].  Thx

## 2017-08-09 NOTE — Telephone Encounter (Signed)
Rx sent 

## 2017-08-11 ENCOUNTER — Ambulatory Visit (INDEPENDENT_AMBULATORY_CARE_PROVIDER_SITE_OTHER): Payer: 59 | Admitting: Anesthesiology

## 2017-08-11 DIAGNOSIS — N921 Excessive and frequent menstruation with irregular cycle: Secondary | ICD-10-CM

## 2017-08-11 MED ORDER — MEDROXYPROGESTERONE ACETATE 150 MG/ML IM SUSP
150.0000 mg | Freq: Once | INTRAMUSCULAR | Status: AC
  Administered 2017-08-11: 150 mg via INTRAMUSCULAR

## 2017-11-07 ENCOUNTER — Ambulatory Visit (INDEPENDENT_AMBULATORY_CARE_PROVIDER_SITE_OTHER): Payer: 59 | Admitting: Anesthesiology

## 2017-11-07 ENCOUNTER — Telehealth: Payer: Self-pay | Admitting: *Deleted

## 2017-11-07 DIAGNOSIS — N921 Excessive and frequent menstruation with irregular cycle: Secondary | ICD-10-CM

## 2017-11-07 MED ORDER — MEDROXYPROGESTERONE ACETATE 150 MG/ML IM SUSP
150.0000 mg | Freq: Once | INTRAMUSCULAR | Status: AC
  Administered 2017-11-07: 150 mg via INTRAMUSCULAR

## 2017-11-07 NOTE — Telephone Encounter (Signed)
patient called requesting refill on depo provera Rx. I told her I sent Rx with refill on 08/09/17. I called [facility] to confirm refill and they do have it Rx will be filled for patient to pick up.

## 2018-02-05 ENCOUNTER — Telehealth: Payer: Self-pay | Admitting: *Deleted

## 2018-02-05 ENCOUNTER — Ambulatory Visit (INDEPENDENT_AMBULATORY_CARE_PROVIDER_SITE_OTHER): Payer: 59 | Admitting: Anesthesiology

## 2018-02-05 DIAGNOSIS — N921 Excessive and frequent menstruation with irregular cycle: Secondary | ICD-10-CM | POA: Diagnosis not present

## 2018-02-05 MED ORDER — MEDROXYPROGESTERONE ACETATE 150 MG/ML IM SUSP: 150.0000 mg | Freq: Once | INTRAMUSCULAR | 0 refills | Status: DC

## 2018-02-05 MED ORDER — MEDROXYPROGESTERONE ACETATE 150 MG/ML IM SUSP
150.0000 mg | Freq: Once | INTRAMUSCULAR | Status: AC
  Administered 2018-02-05: 150 mg via INTRAMUSCULAR

## 2018-02-05 NOTE — Telephone Encounter (Signed)
-----   Message from Sinclair Grooms sent at 02/05/2018  8:48 AM EST ----- Regarding: RX Patient coming in for depo today needs RX called in.

## 2018-02-05 NOTE — Telephone Encounter (Signed)
annual exam scheduled on 02/12/18

## 2018-02-12 ENCOUNTER — Encounter: Payer: Self-pay | Admitting: Obstetrics & Gynecology

## 2018-02-12 ENCOUNTER — Ambulatory Visit (INDEPENDENT_AMBULATORY_CARE_PROVIDER_SITE_OTHER): Payer: 59 | Admitting: Obstetrics & Gynecology

## 2018-02-12 VITALS — BP 130/80 | Ht 66.0 in | Wt 212.0 lb

## 2018-02-12 DIAGNOSIS — E6609 Other obesity due to excess calories: Secondary | ICD-10-CM

## 2018-02-12 DIAGNOSIS — Z6834 Body mass index (BMI) 34.0-34.9, adult: Secondary | ICD-10-CM

## 2018-02-12 DIAGNOSIS — D219 Benign neoplasm of connective and other soft tissue, unspecified: Secondary | ICD-10-CM | POA: Diagnosis not present

## 2018-02-12 DIAGNOSIS — Z3042 Encounter for surveillance of injectable contraceptive: Secondary | ICD-10-CM | POA: Diagnosis not present

## 2018-02-12 DIAGNOSIS — Z01419 Encounter for gynecological examination (general) (routine) without abnormal findings: Secondary | ICD-10-CM | POA: Diagnosis not present

## 2018-02-12 MED ORDER — MEDROXYPROGESTERONE ACETATE 150 MG/ML IM SUSP: 150.0000 mg | INTRAMUSCULAR | 4 refills | Status: DC

## 2018-02-12 NOTE — Patient Instructions (Signed)
1. Well female exam with routine gynecological exam Gynecologic exam with stable uterine fibroids.  Pap test was negative with negative high-risk HPV September 2018.  Will repeat at 2 to 3 years.  Breast exam normal.  Screening mammogram March 2019 was negative.  Health labs with family physician.  2. Encounter for surveillance of injectable contraceptive Well on Depo-Provera injections.  No contraindication to continue.  Represcribed.  3. Fibroids Stable and asymptomatic since last pelvic ultrasound September 2019.  4. Class 1 obesity due to excess calories without serious comorbidity with body mass index (BMI) of 34.0 to 34.9 in adult Recommend aerobic physical activities 5 times a week with weightlifting every 2 days.  Lower calorie/carb diet such as [employer].  Other orders - medroxyPROGESTERone (DEPO-PROVERA) 150 MG/ML injection; Inject 1 mL (150 mg total) into the muscle every 3 (three) months.  Other orders - medroxyPROGESTERone (DEPO-PROVERA) 150 MG/ML injection; Inject 1 mL (150 mg total) into the muscle every 3 (three) months.  Arlette, it was a pleasure seeing you today!

## 2018-02-12 NOTE — Progress Notes (Signed)
Laurie Cervantes 1970/07/19 [id number]   History:    47 y.o. G3P2A1L2 Single  RP:  Established patient presenting for annual gyn exam   HPI: Well on Depo-Provera injections with very light menses.  No pelvic pain.  No breakthrough bleeding.  Abstinent since last annual gynecologic exam in September 2018.  At that time her Pap test was negative with negative high-risk HPV and a full STI screen was negative.  Urine and bowel movements normal.  Breasts normal.  Body mass index 34.22.  Not regularly physically active.  Health labs with family physician.  Past medical history,surgical history, family history and social history were all reviewed and documented in the EPIC chart.  Gynecologic History No LMP recorded. Patient has had an injection. Contraception: Depo-Provera injections Last Pap: 11/2016. Results were: Negative/HPV HR neg Last mammogram: 05/2017. Results were: Negative Bone Density: Never Colonoscopy: Never  Obstetric History OB History  Gravida Para Term Preterm AB Living  3 2 2   1 2   SAB TAB Ectopic Multiple Live Births  1       2    # Outcome Date GA Lbr Len/2nd Weight Sex Delivery Anes PTL Lv  3 SAB           2 Term     M Vag-Spont  N LIV  1 Term     M Vag-Spont  N LIV     ROS: A ROS was performed and pertinent positives and negatives are included in the history.  GENERAL: No fevers or chills. HEENT: No change in vision, no earache, sore throat or sinus congestion. NECK: No pain or stiffness. CARDIOVASCULAR: No chest pain or pressure. No palpitations. PULMONARY: No shortness of breath, cough or wheeze. GASTROINTESTINAL: No abdominal pain, nausea, vomiting or diarrhea, melena or bright red blood per rectum. GENITOURINARY: No urinary frequency, urgency, hesitancy or dysuria. MUSCULOSKELETAL: No joint or muscle pain, no back pain, no recent trauma. DERMATOLOGIC: No rash, no itching, no lesions. ENDOCRINE: No polyuria, polydipsia, no heat or cold intolerance. No recent  change in weight. HEMATOLOGICAL: No anemia or easy bruising or bleeding. NEUROLOGIC: No headache, seizures, numbness, tingling or weakness. PSYCHIATRIC: No depression, no loss of interest in normal activity or change in sleep pattern.     Exam:   BP 130/80   Ht 5\' 6"  (1.676 m)   Wt 212 lb (96.2 kg)   BMI 34.22 kg/m   Body mass index is 34.22 kg/m.  General appearance : Well developed well nourished female. No acute distress HEENT: Eyes: no retinal hemorrhage or exudates,  Neck supple, trachea midline, no carotid bruits, no thyroidmegaly Lungs: Clear to auscultation, no rhonchi or wheezes, or rib retractions  Heart: Regular rate and rhythm, no murmurs or gallops Breast:Examined in sitting and supine position were symmetrical in appearance, no palpable masses or tenderness,  no skin retraction, no nipple inversion, no nipple discharge, no skin discoloration, no axillary or supraclavicular lymphadenopathy Abdomen: no palpable masses or tenderness, no rebound or guarding Extremities: no edema or skin discoloration or tenderness  Pelvic: Vulva: Normal             Vagina: No gross lesions or discharge  Cervix: No gross lesions or discharge  Uterus  AV, mildly increased in volume with Fibroids, mobile, non-tender  Adnexa  Without masses or tenderness  Anus: Normal   Assessment/Plan:  47 y.o. female for annual exam   1. Well female exam with routine gynecological exam Gynecologic exam with stable uterine fibroids.  Pap test was negative with negative high-risk HPV September 2018.  Will repeat at 2 to 3 years.  Breast exam normal.  Screening mammogram March 2019 was negative.  Health labs with family physician.  2. Encounter for surveillance of injectable contraceptive Well on Depo-Provera injections.  No contraindication to continue.  Represcribed.  3. Fibroids Stable and asymptomatic since last pelvic ultrasound September 2019.  4. Class 1 obesity due to excess calories without  serious comorbidity with body mass index (BMI) of 34.0 to 34.9 in adult Recommend aerobic physical activities 5 times a week with weightlifting every 2 days.  Lower calorie/carb diet such as [employer].  Other orders - medroxyPROGESTERone (DEPO-PROVERA) 150 MG/ML injection; Inject 1 mL (150 mg total) into the muscle every 3 (three) months.  Other orders - medroxyPROGESTERone (DEPO-PROVERA) 150 MG/ML injection; Inject 1 mL (150 mg total) into the muscle every 3 (three) months.  Princess Bruins MD, 3:01 PM 02/12/2018

## 2018-04-26 ENCOUNTER — Ambulatory Visit (INDEPENDENT_AMBULATORY_CARE_PROVIDER_SITE_OTHER): Payer: 59 | Admitting: Anesthesiology

## 2018-04-26 DIAGNOSIS — N921 Excessive and frequent menstruation with irregular cycle: Secondary | ICD-10-CM

## 2018-04-26 MED ORDER — MEDROXYPROGESTERONE ACETATE 150 MG/ML IM SUSP
150.0000 mg | Freq: Once | INTRAMUSCULAR | Status: AC
  Administered 2018-04-26: 150 mg via INTRAMUSCULAR

## 2018-05-02 ENCOUNTER — Other Ambulatory Visit: Payer: Self-pay | Admitting: Obstetrics & Gynecology

## 2018-05-02 DIAGNOSIS — Z1231 Encounter for screening mammogram for malignant neoplasm of breast: Secondary | ICD-10-CM

## 2018-06-08 ENCOUNTER — Ambulatory Visit: Payer: 59

## 2018-07-23 ENCOUNTER — Ambulatory Visit (INDEPENDENT_AMBULATORY_CARE_PROVIDER_SITE_OTHER): Payer: 59 | Admitting: Anesthesiology

## 2018-07-23 ENCOUNTER — Other Ambulatory Visit: Payer: Self-pay

## 2018-07-23 DIAGNOSIS — N921 Excessive and frequent menstruation with irregular cycle: Secondary | ICD-10-CM | POA: Diagnosis not present

## 2018-07-23 MED ORDER — MEDROXYPROGESTERONE ACETATE 150 MG/ML IM SUSP
150.0000 mg | Freq: Once | INTRAMUSCULAR | Status: AC
  Administered 2018-07-23: 150 mg via INTRAMUSCULAR

## 2018-07-27 ENCOUNTER — Ambulatory Visit: Payer: 59

## 2018-09-14 ENCOUNTER — Ambulatory Visit
Admission: RE | Admit: 2018-09-14 | Discharge: 2018-09-14 | Disposition: A | Discharge status: Home / Self Care | Payer: 59 | Source: Ambulatory Visit | Attending: Obstetrics & Gynecology | Admitting: Obstetrics & Gynecology

## 2018-09-14 DIAGNOSIS — Z1231 Encounter for screening mammogram for malignant neoplasm of breast: Secondary | ICD-10-CM

## 2018-10-08 ENCOUNTER — Ambulatory Visit (INDEPENDENT_AMBULATORY_CARE_PROVIDER_SITE_OTHER): Payer: 59 | Admitting: Anesthesiology

## 2018-10-08 ENCOUNTER — Other Ambulatory Visit: Payer: Self-pay

## 2018-10-08 DIAGNOSIS — N921 Excessive and frequent menstruation with irregular cycle: Secondary | ICD-10-CM | POA: Diagnosis not present

## 2018-10-08 MED ORDER — MEDROXYPROGESTERONE ACETATE 150 MG/ML IM SUSP
150.0000 mg | Freq: Once | INTRAMUSCULAR | Status: AC
  Administered 2018-10-08: 150 mg via INTRAMUSCULAR

## 2018-12-31 ENCOUNTER — Ambulatory Visit (INDEPENDENT_AMBULATORY_CARE_PROVIDER_SITE_OTHER): Payer: 59 | Admitting: Anesthesiology

## 2018-12-31 ENCOUNTER — Other Ambulatory Visit: Payer: Self-pay

## 2018-12-31 DIAGNOSIS — N921 Excessive and frequent menstruation with irregular cycle: Secondary | ICD-10-CM

## 2018-12-31 MED ORDER — MEDROXYPROGESTERONE ACETATE 150 MG/ML IM SUSP
150.0000 mg | Freq: Once | INTRAMUSCULAR | Status: AC
  Administered 2018-12-31: 150 mg via INTRAMUSCULAR

## 2019-03-19 ENCOUNTER — Other Ambulatory Visit: Payer: Self-pay

## 2019-03-20 ENCOUNTER — Other Ambulatory Visit: Payer: Self-pay | Admitting: Obstetrics & Gynecology

## 2019-03-20 NOTE — Telephone Encounter (Signed)
Annual exam scheduled on 05/28/19

## 2019-03-21 ENCOUNTER — Other Ambulatory Visit: Payer: Self-pay

## 2019-03-21 ENCOUNTER — Ambulatory Visit (INDEPENDENT_AMBULATORY_CARE_PROVIDER_SITE_OTHER): Payer: 59 | Admitting: Anesthesiology

## 2019-03-21 DIAGNOSIS — N921 Excessive and frequent menstruation with irregular cycle: Secondary | ICD-10-CM

## 2019-03-21 MED ORDER — MEDROXYPROGESTERONE ACETATE 150 MG/ML IM SUSP
150.0000 mg | Freq: Once | INTRAMUSCULAR | Status: AC
  Administered 2019-03-21: 11:00:00 150 mg via INTRAMUSCULAR

## 2019-04-29 ENCOUNTER — Ambulatory Visit (INDEPENDENT_AMBULATORY_CARE_PROVIDER_SITE_OTHER): Payer: 59 | Admitting: Family

## 2019-04-29 ENCOUNTER — Encounter: Payer: Self-pay | Admitting: Family

## 2019-04-29 ENCOUNTER — Other Ambulatory Visit: Payer: Self-pay

## 2019-04-29 VITALS — BP 125/82 | HR 75 | Temp 97.1°F | Resp 16 | Ht 66.0 in | Wt 215.0 lb

## 2019-04-29 DIAGNOSIS — B009 Herpesviral infection, unspecified: Secondary | ICD-10-CM

## 2019-04-29 DIAGNOSIS — Z720 Tobacco use: Secondary | ICD-10-CM

## 2019-04-29 DIAGNOSIS — I10 Essential (primary) hypertension: Secondary | ICD-10-CM | POA: Diagnosis not present

## 2019-04-29 LAB — BASIC METABOLIC PANEL
BUN: 12 mg/dL (ref 6–23)
CO2: 27 mEq/L (ref 19–32)
Calcium: 10 mg/dL (ref 8.4–10.5)
Chloride: 102 mEq/L (ref 96–112)
Creatinine, Ser: 0.93 mg/dL (ref 0.40–1.20)
GFR: 78.36 mL/min (ref >60.00)
Glucose, Bld: 103 mg/dL — ABNORMAL HIGH (ref 70–99)
Potassium: 3.7 mEq/L (ref 3.5–5.1)
Sodium: 139 mEq/L (ref 135–145)

## 2019-04-29 MED ORDER — TRIAMTERENE-HCTZ 37.5-25 MG PO CAPS: 1.0000 | ORAL_CAPSULE | Freq: Every day | ORAL | 0 refills | Status: DC

## 2019-04-29 NOTE — Patient Instructions (Addendum)
Please complete lab work prior to leaving.  Welcome to [facility]! 

## 2019-04-29 NOTE — Progress Notes (Signed)
Subjective:    Patient ID: Laurie Cervantes, female    DOB: 01-30-1971, 49 y.o.   MRN: [id number]  HPI  Patient is a 49 yr old female who presents today to establish care.  Pmhx is significant for:  HTN- maintained on dyazide which was started 2 weeks ago.  [facility] due to some chest soreness.  Reports that they performed an EKG and was told that it was normal. She states that she does a lot of heavy lifting at her job at [employer].  She sas diagnosed with HTN.  BP Readings from Last 3 Encounters:  04/29/19 125/82  02/12/18 130/80  11/28/16 130/86   Reports that she has improved her diet since she was diagnosed with hypertension. Trying to eat a low sodium diet. Walks regularly.   HSV2- diagnosed in 2014. Denies issues with recurrent outbreaks.    Tobacco abuse- 1-2 cigarettes a day.  Review of Systems  Constitutional: Negative for unexpected weight change.  HENT: Negative for hearing loss and rhinorrhea.   Eyes: Negative for visual disturbance.  Respiratory: Negative for cough and shortness of breath.   Cardiovascular: Negative for chest pain (intermittend chest wall tightness).  Gastrointestinal: Negative for diarrhea and nausea.  Genitourinary: Negative for dysuria, frequency and hematuria.  Skin: Negative for rash.  Neurological: Positive for headaches (prior to BP control, now improved).  Hematological: Negative for adenopathy.  Psychiatric/Behavioral:       Denies depression/anxiety   Past Medical History:  Diagnosis Date  . Bacterial vaginosis   . 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 Hypertension      Social History   Socioeconomic History  . Marital status: Single    Spouse name: Not on file  . Number of children: Not on file  . Years of education: Not on file  . Highest education level: Not on file  Occupational History  . Not on file  Tobacco Use  . Smoking status: Current Some Day Smoker    Types: Cigarettes    . Smokeless tobacco: Never Used  Substance and Sexual Activity  . Alcohol use: No  . Drug use: No  . Sexual activity: Not Currently  Other Topics Concern  . Not on file  Social History Narrative   Work [employer]   2 grown sons   DIvorced   Enjoys shopping   No pets.    Social Determinants of Health   Financial Resource Strain:   . Difficulty of Paying Living Expenses: Not on file  Food Insecurity:   . Worried About Charity fundraiser in the Last Year: Not on file  . Ran Out of Food in the Last Year: Not on file  Transportation Needs:   . Lack of Transportation (Medical): Not on file  . Lack of Transportation (Non-Medical): Not on file  Physical Activity:   . Days of Exercise per Week: Not on file  . Minutes of Exercise per Session: Not on file  Stress:   . Feeling of Stress : Not on file  Social Connections:   . Frequency of Communication with Friends and Family: Not on file  . Frequency of Social Gatherings with Friends and Family: Not on file  . Attends Religious Services: Not on file  . Active Member of Clubs or Organizations: Not on file  . Attends Archivist Meetings: Not on file  . Marital Status: Not on file  Intimate Partner  Violence:   . Fear of Current or Ex-Partner: Not on file  . Emotionally Abused: Not on file  . Physically Abused: Not on file  . Sexually Abused: Not on file    Past Surgical History:  Procedure Laterality Date  . HAND SURGERY Left 2016   approximate date  . TUBAL LIGATION     PPTL    Family History  Problem Relation Age of Onset  . Hypertension Mother   . Diabetes Mellitus II Mother   . Breast cancer Maternal Aunt   . Hypertension Brother     No Known Allergies  Current Outpatient Medications on File Prior to Visit  Medication Sig Dispense Refill  . medroxyPROGESTERone Acetate 150 MG/ML SUSY INJECT 1 ML (150 MG TOTAL) INTO THE MUSCLE EVERY 3 MONTHS 1 mL 0   No current facility-administered  medications on file prior to visit.    BP 125/82 (BP Location: Right Arm, Patient Position: Sitting, Cuff Size: Large)   Pulse 75   Temp (!) 97.1 F (36.2 C) (Temporal)   Resp 16   Ht 5\' 6"  (1.676 m)   Wt 215 lb (97.5 kg)   SpO2 100%   BMI 34.70 kg/m        Objective:   Physical Exam Constitutional:      Appearance: She is well-developed.  Neck:     Thyroid: No thyromegaly.  Cardiovascular:     Rate and Rhythm: Normal rate and regular rhythm.     Heart sounds: Normal heart sounds. No murmur.  Pulmonary:     Effort: Pulmonary effort is normal. No respiratory distress.     Breath sounds: Normal breath sounds. No wheezing.  Musculoskeletal:     Cervical back: Neck supple.  Skin:    General: Skin is warm and dry.  Neurological:     Mental Status: She is alert and oriented to person, place, and time.  Psychiatric:        Behavior: Behavior normal.        Thought Content: Thought content normal.        Judgment: Judgment normal.           Assessment & Plan:  HTN- bp is improved on dyazide. Pt counseled on low sodium diet, exercise, weight loss.  Continue same, obtain bmet.  Tobacco abuse- discussed importance of smoking cessation.  HSV2- stable without suppressive therapy.   30 minutes spent on today's visit.   This visit occurred during the SARS-CoV-2 public health emergency.  Safety protocols were in place, including screening questions prior to the visit, additional usage of staff PPE, and extensive cleaning of exam room while observing appropriate contact time as indicated for disinfecting solutions.

## 2019-05-28 ENCOUNTER — Encounter: Payer: 59 | Admitting: Obstetrics & Gynecology

## 2019-06-03 ENCOUNTER — Other Ambulatory Visit: Payer: Self-pay

## 2019-06-04 ENCOUNTER — Ambulatory Visit (INDEPENDENT_AMBULATORY_CARE_PROVIDER_SITE_OTHER): Payer: 59 | Admitting: Family

## 2019-06-04 ENCOUNTER — Other Ambulatory Visit: Payer: Self-pay

## 2019-06-04 ENCOUNTER — Encounter: Payer: Self-pay | Admitting: Family

## 2019-06-04 VITALS — BP 126/72 | HR 73 | Temp 97.9°F | Resp 16 | Ht 66.0 in | Wt 209.0 lb

## 2019-06-04 DIAGNOSIS — Z0000 Encounter for general adult medical examination without abnormal findings: Secondary | ICD-10-CM

## 2019-06-04 DIAGNOSIS — E669 Obesity, unspecified: Secondary | ICD-10-CM

## 2019-06-04 LAB — CBC WITH DIFFERENTIAL/PLATELET
Basophils Absolute: 0.1 10*3/uL (ref 0.0–0.1)
Basophils Relative: 1 % (ref 0.0–3.0)
Eosinophils Absolute: 0.3 10*3/uL (ref 0.0–0.7)
Eosinophils Relative: 3.6 % (ref 0.0–5.0)
HCT: 38.3 % (ref 36.0–46.0)
Hemoglobin: 12.9 g/dL (ref 12.0–15.0)
Lymphocytes Relative: 27 % (ref 12.0–46.0)
Lymphs Abs: 2 10*3/uL (ref 0.7–4.0)
MCHC: 33.6 g/dL (ref 30.0–36.0)
MCV: 84.7 fl (ref 78.0–100.0)
Monocytes Absolute: 0.7 10*3/uL (ref 0.1–1.0)
Monocytes Relative: 10 % (ref 3.0–12.0)
Neutro Abs: 4.3 10*3/uL (ref 1.4–7.7)
Neutrophils Relative %: 58.4 % (ref 43.0–77.0)
Platelets: 389 10*3/uL (ref 150.0–400.0)
RBC: 4.53 Mil/uL (ref 3.87–5.11)
RDW: 13.5 % (ref 11.5–15.5)
WBC: 7.4 10*3/uL (ref 4.0–10.5)

## 2019-06-04 LAB — LIPID PANEL
Cholesterol: 139 mg/dL (ref 0–200)
HDL: 32.6 mg/dL — ABNORMAL LOW (ref >39.00)
LDL Cholesterol: 97 mg/dL (ref 0–99)
NonHDL: 106.19
Total CHOL/HDL Ratio: 4
Triglycerides: 46 mg/dL (ref 0.0–149.0)
VLDL: 9.2 mg/dL (ref 0.0–40.0)

## 2019-06-04 LAB — BASIC METABOLIC PANEL
BUN: 16 mg/dL (ref 6–23)
CO2: 26 mEq/L (ref 19–32)
Calcium: 9.7 mg/dL (ref 8.4–10.5)
Chloride: 105 mEq/L (ref 96–112)
Creatinine, Ser: 0.88 mg/dL (ref 0.40–1.20)
GFR: 83.48 mL/min (ref >60.00)
Glucose, Bld: 95 mg/dL (ref 70–99)
Potassium: 3.5 mEq/L (ref 3.5–5.1)
Sodium: 137 mEq/L (ref 135–145)

## 2019-06-04 LAB — HEPATIC FUNCTION PANEL
ALT: 13 U/L (ref 0–35)
AST: 17 U/L (ref 0–37)
Albumin: 4.2 g/dL (ref 3.5–5.2)
Alkaline Phosphatase: 66 U/L (ref 39–117)
Bilirubin, Direct: 0.1 mg/dL (ref 0.0–0.3)
Total Bilirubin: 0.4 mg/dL (ref 0.2–1.2)
Total Protein: 6.8 g/dL (ref 6.0–8.3)

## 2019-06-04 LAB — TSH: TSH: 1.17 u[IU]/mL (ref 0.35–4.50)

## 2019-06-04 NOTE — Patient Instructions (Signed)
Please complete lab work prior to leaving. Continue your work on healthy diet, exercise and weight loss.   Preventive Care 40-47 Years Old, Female Preventive care refers to visits with your health care provider and lifestyle choices that can promote health and wellness. This includes:  A yearly physical exam. This may also be called an annual well check.  Regular dental visits and eye exams.  Immunizations.  Screening for certain conditions.  Healthy lifestyle choices, such as eating a healthy diet, getting regular exercise, not using drugs or products that contain nicotine and tobacco, and limiting alcohol use. What can I expect for my preventive care visit? Physical exam Your health care provider will check your:  Height and weight. This may be used to calculate body mass index (BMI), which tells if you are at a healthy weight.  Heart rate and blood pressure.  Skin for abnormal spots. Counseling Your health care provider may ask you questions about your:  Alcohol, tobacco, and drug use.  Emotional well-being.  Home and relationship well-being.  Sexual activity.  Eating habits.  Work and work environment.  Method of birth control.  Menstrual cycle.  Pregnancy history. What immunizations do I need?  Influenza (flu) vaccine  This is recommended every year. Tetanus, diphtheria, and pertussis (Tdap) vaccine  You may need a Td booster every 10 years. Varicella (chickenpox) vaccine  You may need this if you have not been vaccinated. Zoster (shingles) vaccine  You may need this after age 60. Measles, mumps, and rubella (MMR) vaccine  You may need at least one dose of MMR if you were born in 1957 or later. You may also need a second dose. Pneumococcal conjugate (PCV13) vaccine  You may need this if you have certain conditions and were not previously vaccinated. Pneumococcal polysaccharide (PPSV23) vaccine  You may need one or two doses if you smoke  cigarettes or if you have certain conditions. Meningococcal conjugate (MenACWY) vaccine  You may need this if you have certain conditions. Hepatitis A vaccine  You may need this if you have certain conditions or if you travel or work in places where you may be exposed to hepatitis A. Hepatitis B vaccine  You may need this if you have certain conditions or if you travel or work in places where you may be exposed to hepatitis B. Haemophilus influenzae type b (Hib) vaccine  You may need this if you have certain conditions. Human papillomavirus (HPV) vaccine  If recommended by your health care provider, you may need three doses over 6 months. You may receive vaccines as individual doses or as more than one vaccine together in one shot (combination vaccines). Talk with your health care provider about the risks and benefits of combination vaccines. What tests do I need? Blood tests  Lipid and cholesterol levels. These may be checked every 5 years, or more frequently if you are over 50 years old.  Hepatitis C test.  Hepatitis B test. Screening  Lung cancer screening. You may have this screening every year starting at age 55 if you have a 30-pack-year history of smoking and currently smoke or have quit within the past 15 years.  Colorectal cancer screening. All adults should have this screening starting at age 50 and continuing until age 75. Your health care provider may recommend screening at age 45 if you are at increased risk. You will have tests every 1-10 years, depending on your results and the type of screening test.  Diabetes screening. This is done   done by checking your blood sugar (glucose) after you have not eaten for a while (fasting). You may have this done every 1-3 years.  Mammogram. This may be done every 1-2 years. Talk with your health care provider about when you should start having regular mammograms. This may depend on whether you have a family history of breast  cancer.  BRCA-related cancer screening. This may be done if you have a family history of breast, ovarian, tubal, or peritoneal cancers.  Pelvic exam and Pap test. This may be done every 3 years starting at age 52. Starting at age 22, this may be done every 5 years if you have a Pap test in combination with an HPV test. Other tests  Sexually transmitted disease (STD) testing.  Bone density scan. This is done to screen for osteoporosis. You may have this scan if you are at high risk for osteoporosis. Follow these instructions at home: Eating and drinking  Eat a diet that includes fresh fruits and vegetables, whole grains, lean protein, and low-fat dairy.  Take vitamin and mineral supplements as recommended by your health care provider.  Do not drink alcohol if: ? Your health care provider tells you not to drink. ? You are pregnant, may be pregnant, or are planning to become pregnant.  If you drink alcohol: ? Limit how much you have to 0-1 drink a day. ? Be aware of how much alcohol is in your drink. In the [place] one drink equals one 12 oz bottle of beer (355 mL), one 5 oz glass of wine (148 mL), or one 1 oz glass of hard liquor (44 mL). Lifestyle  Take daily care of your teeth and gums.  Stay active. Exercise for at least 30 minutes on 5 or more days each week.  Do not use any products that contain nicotine or tobacco, such as cigarettes, e-cigarettes, and chewing tobacco. If you need help quitting, ask your health care provider.  If you are sexually active, practice safe sex. Use a condom or other form of birth control (contraception) in order to prevent pregnancy and STIs (sexually transmitted infections).  If told by your health care provider, take low-dose aspirin daily starting at age 41. What's next?  Visit your health care provider once a year for a well check visit.  Ask your health care provider how often you should have your eyes and teeth checked.  Stay up to date  on all vaccines. This information is not intended to replace advice given to you by your health care provider. Make sure you discuss any questions you have with your health care provider. Document Revised: 11/02/2017 Document Reviewed: 11/02/2017 Elsevier Patient Education  2020 [employer].

## 2019-06-04 NOTE — Progress Notes (Signed)
Subjective:    Patient ID: Laurie Cervantes, female    DOB: 1970/08/18, 49 y.o.   MRN: [id number]  HPI   Patient is a 49 yr old female who presents today for cpx.  Patient presents today for complete physical.  Immunizations: tetanus up to date.   Diet: has been watching what she eats Wt Readings from Last 3 Encounters:  06/04/19 209 lb (94.8 kg)  04/29/19 215 lb (97.5 kg)  02/12/18 212 lb (96.2 kg)  Exercise: some walking Pap Smear: scheduled for next month- Dr. Dellis Filbert Mammogram: scheduled Vision: up to date Dental:  Up to date  Reports that she is anxious that the least little thing will drive my pressure up.    BP Readings from Last 3 Encounters:  06/04/19 126/72  04/29/19 125/82  02/12/18 130/80      Review of Systems  Constitutional: Negative for unexpected weight change.  HENT: Negative for hearing loss and rhinorrhea.   Eyes: Negative for visual disturbance.  Respiratory: Negative for cough and shortness of breath.   Cardiovascular: Negative for chest pain.  Gastrointestinal: Negative for anal bleeding, constipation and diarrhea.  Genitourinary: Negative for dysuria, frequency and hematuria.  Musculoskeletal: Negative for arthralgias and myalgias.  Skin: Negative for rash.  Neurological: Positive for headaches (occasional HA's).  Hematological: Negative for adenopathy.  Psychiatric/Behavioral:       Denies depression   Past Medical History:  Diagnosis Date  . Bacterial vaginosis   . 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 Hypertension      Social History   Socioeconomic History  . Marital status: Single    Spouse name: Not on file  . Number of children: Not on file  . Years of education: Not on file  . Highest education level: Not on file  Occupational History  . Not on file  Tobacco Use  . Smoking status: Current Some Day Smoker    Types: Cigarettes  . Smokeless tobacco: Never Used  Substance and Sexual Activity  . Alcohol  use: No  . Drug use: No  . Sexual activity: Not Currently  Other Topics Concern  . Not on file  Social History Narrative   Work [employer]   2 grown sons   DIvorced   Enjoys shopping   No pets.    Social Determinants of Health   Financial Resource Strain:   . Difficulty of Paying Living Expenses:   Food Insecurity:   . Worried About Charity fundraiser in the Last Year:   . Arboriculturist in the Last Year:   Transportation Needs:   . Film/video editor (Medical):   Marland Kitchen Lack of Transportation (Non-Medical):   Physical Activity:   . Days of Exercise per Week:   . Minutes of Exercise per Session:   Stress:   . Feeling of Stress :   Social Connections:   . Frequency of Communication with Friends and Family:   . Frequency of Social Gatherings with Friends and Family:   . Attends Religious Services:   . Active Member of Clubs or Organizations:   . Attends Archivist Meetings:   Marland Kitchen Marital Status:   Intimate Partner Violence:   . Fear of Current or Ex-Partner:   . Emotionally Abused:   Marland Kitchen Physically Abused:   . Sexually Abused:     Past Surgical History:  Procedure Laterality Date  . HAND SURGERY Left 2016   approximate date  .  TUBAL LIGATION     PPTL    Family History  Problem Relation Age of Onset  . Hypertension Mother   . Diabetes Mellitus II Mother   . Breast cancer Maternal Aunt   . Hypertension Brother     No Known Allergies  Current Outpatient Medications on File Prior to Visit  Medication Sig Dispense Refill  . medroxyPROGESTERone Acetate 150 MG/ML SUSY INJECT 1 ML (150 MG TOTAL) INTO THE MUSCLE EVERY 3 MONTHS 1 mL 0  . triamterene-hydrochlorothiazide (DYAZIDE) 37.5-25 MG capsule Take 1 each (1 capsule total) by mouth daily. 90 capsule 0   No current facility-administered medications on file prior to visit.    BP 126/72 (BP Location: Right Arm, Patient Position: Sitting, Cuff Size: Large)   Pulse 73   Temp 97.9 F (36.6  C) (Temporal)   Resp 16   Ht 5\' 6"  (1.676 m)   Wt 209 lb (94.8 kg)   SpO2 100%   BMI 33.73 kg/m       Objective:   Physical Exam  Physical Exam  Constitutional: She is oriented to person, place, and time. She appears well-developed and well-nourished. No distress.  HENT:  Head: Normocephalic and atraumatic.  Right Ear: Tympanic membrane and ear canal normal.  Left Ear: Tympanic membrane and ear canal normal.  Mouth/Throat: not examined- pt wearing mask Eyes: Pupils are equal, round, and reactive to light. No scleral icterus.  Neck: Normal range of motion. No thyromegaly present.  Cardiovascular: Normal rate and regular rhythm.   No murmur heard. Pulmonary/Chest: Effort normal and breath sounds normal. No respiratory distress. He has no wheezes. She has no rales. She exhibits no tenderness.  Abdominal: Soft. Bowel sounds are normal. She exhibits no distension and no mass. There is no tenderness. There is no rebound and no guarding.  Musculoskeletal: She exhibits no edema.  Lymphadenopathy:    She has no cervical adenopathy.  Neurological: She is alert and oriented to person, place, and time. She has normal patellar reflexes. She exhibits normal muscle tone. Coordination normal.  Skin: Skin is warm and dry.  Psychiatric: She has a normal mood and affect. Her behavior is normal. Judgment and thought content normal.  Breast/pelvic: deferred        Assessment & Plan:   Preventative care- discussed healthy diet, exercise, weight loss. Referral placed to nutrition at pt's request. Pap is scheduled. Mammo up to date. She is smoking 1 cigarette/ day and we discussed complete smoking cessation.  This visit occurred during the SARS-CoV-2 public health emergency.  Safety protocols were in place, including screening questions prior to the visit, additional usage of staff PPE, and extensive cleaning of exam room while observing appropriate contact time as indicated for disinfecting  solutions.        Assessment & Plan:

## 2019-06-11 ENCOUNTER — Other Ambulatory Visit: Payer: Self-pay

## 2019-06-11 MED ORDER — TRIAMTERENE-HCTZ 37.5-25 MG PO CAPS: 1.0000 | ORAL_CAPSULE | Freq: Every day | ORAL | 1 refills | Status: DC

## 2019-06-12 ENCOUNTER — Other Ambulatory Visit: Payer: Self-pay

## 2019-06-12 ENCOUNTER — Encounter: Payer: Self-pay | Admitting: Family Medicine

## 2019-06-12 ENCOUNTER — Ambulatory Visit (INDEPENDENT_AMBULATORY_CARE_PROVIDER_SITE_OTHER): Payer: 59 | Admitting: Family Medicine

## 2019-06-12 VITALS — Ht 66.0 in | Wt 205.0 lb

## 2019-06-12 DIAGNOSIS — J069 Acute upper respiratory infection, unspecified: Secondary | ICD-10-CM

## 2019-06-12 MED ORDER — FLUTICASONE PROPIONATE 50 MCG/ACT NA SUSP: 2.0000 | Freq: Every day | NASAL | 6 refills | Status: AC

## 2019-06-12 MED ORDER — LEVOCETIRIZINE DIHYDROCHLORIDE 5 MG PO TABS: 5.0000 mg | ORAL_TABLET | Freq: Every evening | ORAL | 5 refills | Status: DC

## 2019-06-12 NOTE — Progress Notes (Signed)
Virtual Visit via Telephone Note  I connected with Laurie Cervantes on 06/12/19 at 11:00 AM EDT by telephone and verified that I am speaking with the correct person using two identifiers.  Location: Patient: work  Provider: home    I discussed the limitations, risks, security and privacy concerns of performing an evaluation and management service by telephone and the availability of in person appointments. I also discussed with the patient that there may be a patient responsible charge related to this service. The patient expressed understanding and agreed to proceed.   History of Present Illness: Pt is home c/o pnd and sinus headache the last several days.  She has a hx of hbp and did not know what she could take No fevers,  Mucus is clear/ white No cough  No sob    Past Medical History:  Diagnosis Date  . Bacterial vaginosis   . 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 Hypertension    Current Outpatient Medications on File Prior to Visit  Medication Sig Dispense Refill  . medroxyPROGESTERone Acetate 150 MG/ML SUSY INJECT 1 ML (150 MG TOTAL) INTO THE MUSCLE EVERY 3 MONTHS 1 mL 0  . triamterene-hydrochlorothiazide (DYAZIDE) 37.5-25 MG capsule Take 1 each (1 capsule total) by mouth daily. 90 capsule 1   No current facility-administered medications on file prior to visit.    Observations/Objective: There were no vitals filed for this visit.  Pt is in nad  No sob Assessment and Plan: 1. Upper respiratory tract infection, unspecified type flonase and xyzal prn  Call back if symptoms worsen  - fluticasone (FLONASE) 50 MCG/ACT nasal spray; Place 2 sprays into both nostrils daily.  Dispense: 16 g; Refill: 6 - levocetirizine (XYZAL) 5 MG tablet; Take 1 tablet (5 mg total) by mouth every evening.  Dispense: 30 tablet; Refill: 5   Follow Up Instructions:    I discussed the assessment and treatment plan with the patient. The patient was provided an opportunity to ask  questions and all were answered. The patient agreed with the plan and demonstrated an understanding of the instructions.   The patient was advised to call back or seek an in-person evaluation if the symptoms worsen or if the condition fails to improve as anticipated.  I provided 25 minutes of non-face-to-face time during this encounter.--- this includes chart review    Ann Held, DO

## 2019-06-18 ENCOUNTER — Other Ambulatory Visit: Payer: Self-pay | Admitting: Obstetrics & Gynecology

## 2019-06-18 ENCOUNTER — Other Ambulatory Visit: Payer: Self-pay

## 2019-06-18 DIAGNOSIS — Z1231 Encounter for screening mammogram for malignant neoplasm of breast: Secondary | ICD-10-CM

## 2019-06-19 ENCOUNTER — Encounter: Payer: Self-pay | Admitting: Obstetrics & Gynecology

## 2019-06-19 ENCOUNTER — Other Ambulatory Visit: Payer: Self-pay | Admitting: Obstetrics & Gynecology

## 2019-06-19 ENCOUNTER — Ambulatory Visit (INDEPENDENT_AMBULATORY_CARE_PROVIDER_SITE_OTHER): Payer: 59 | Admitting: Obstetrics & Gynecology

## 2019-06-19 ENCOUNTER — Ambulatory Visit
Admission: RE | Admit: 2019-06-19 | Discharge: 2019-06-19 | Disposition: A | Discharge status: Home / Self Care | Payer: 59 | Source: Ambulatory Visit

## 2019-06-19 VITALS — BP 138/86 | Ht 65.0 in | Wt 206.6 lb

## 2019-06-19 DIAGNOSIS — N6310 Unspecified lump in the right breast, unspecified quadrant: Secondary | ICD-10-CM

## 2019-06-19 DIAGNOSIS — Z1231 Encounter for screening mammogram for malignant neoplasm of breast: Secondary | ICD-10-CM

## 2019-06-19 DIAGNOSIS — Z01419 Encounter for gynecological examination (general) (routine) without abnormal findings: Secondary | ICD-10-CM | POA: Diagnosis not present

## 2019-06-19 DIAGNOSIS — Z3042 Encounter for surveillance of injectable contraceptive: Secondary | ICD-10-CM

## 2019-06-19 DIAGNOSIS — E6609 Other obesity due to excess calories: Secondary | ICD-10-CM

## 2019-06-19 DIAGNOSIS — Z6834 Body mass index (BMI) 34.0-34.9, adult: Secondary | ICD-10-CM

## 2019-06-19 DIAGNOSIS — D219 Benign neoplasm of connective and other soft tissue, unspecified: Secondary | ICD-10-CM

## 2019-06-19 MED ORDER — MEDROXYPROGESTERONE ACETATE 150 MG/ML IM SUSP: 150.0000 mg | INTRAMUSCULAR | 4 refills | Status: DC

## 2019-06-19 NOTE — Progress Notes (Signed)
Laurie Cervantes 16-Nov-1970 [id number]   History:    49 y.o. G3P2A1L2 Single  RP:  Established patient presenting for annual gyn exam   HPI: Well on Depo-Provera injections with very light menses.  No pelvic pain.  No breakthrough bleeding.  Abstinent since last annual gynecologic exam in September 2018.  At that time her Pap test was negative with negative high-risk HPV and a full STI screen was negative.  Urine and bowel movements normal.  Breasts normal.  Screening Mammo this am, will need a Rt Dx mammo/US.  Body mass index 34.38.  Not regularly physically active.  Health labs with family physician.   Past medical history,surgical history, family history and social history were all reviewed and documented in the EPIC chart.  Gynecologic History No LMP recorded. Patient has had an injection.  Obstetric History OB History  Gravida Para Term Preterm AB Living  3 2 2   1 2   SAB TAB Ectopic Multiple Live Births  1       2    # Outcome Date GA Lbr Len/2nd Weight Sex Delivery Anes PTL Lv  3 SAB           2 Term     M Vag-Spont  N LIV  1 Term     M Vag-Spont  N LIV     ROS: A ROS was performed and pertinent positives and negatives are included in the history.  GENERAL: No fevers or chills. HEENT: No change in vision, no earache, sore throat or sinus congestion. NECK: No pain or stiffness. CARDIOVASCULAR: No chest pain or pressure. No palpitations. PULMONARY: No shortness of breath, cough or wheeze. GASTROINTESTINAL: No abdominal pain, nausea, vomiting or diarrhea, melena or bright red blood per rectum. GENITOURINARY: No urinary frequency, urgency, hesitancy or dysuria. MUSCULOSKELETAL: No joint or muscle pain, no back pain, no recent trauma. DERMATOLOGIC: No rash, no itching, no lesions. ENDOCRINE: No polyuria, polydipsia, no heat or cold intolerance. No recent change in weight. HEMATOLOGICAL: No anemia or easy bruising or bleeding. NEUROLOGIC: No headache, seizures, numbness, tingling or  weakness. PSYCHIATRIC: No depression, no loss of interest in normal activity or change in sleep pattern.     Exam:   BP 138/86   Ht 5\' 5"  (1.651 m)   Wt 206 lb 9.6 oz (93.7 kg)   BMI 34.38 kg/m   Body mass index is 34.38 kg/m.  General appearance : Well developed well nourished female. No acute distress HEENT: Eyes: no retinal hemorrhage or exudates,  Neck supple, trachea midline, no carotid bruits, no thyroidmegaly Lungs: Clear to auscultation, no rhonchi or wheezes, or rib retractions  Heart: Regular rate and rhythm, no murmurs or gallops Breast:Examined in sitting and supine position were symmetrical in appearance, no palpable masses or tenderness,  no skin retraction, no nipple inversion, no nipple discharge, no skin discoloration, no axillary or supraclavicular lymphadenopathy Abdomen: no palpable masses or tenderness, no rebound or guarding Extremities: no edema or skin discoloration or tenderness  Pelvic: Vulva: Normal             Vagina: No gross lesions or discharge  Cervix: No gross lesions or discharge.  Pap reflex done.  Uterus  AV, increased in size but stable, nodular, non-tender and mobile  Adnexa  Without masses or tenderness  Anus: Normal   Assessment/Plan:  49 y.o. female for annual exam   1. Encounter for routine gynecological examination with Papanicolaou smear of cervix Gynecologic exam with stable uterine fibroids.  Pap reflex done.  Breast exam normal.  Screening mammogram done today at the breast center requires further imaging of the right breast with a right diagnostic mammogram and ultrasound.  Health labs with family physician.  Anxiety associated with new diagnosis of chronic hypertension under treatment currently.  Natural ways to deal with anxiety reviewed including physical activity, yoga, meditation and herbal tea for anxiety.  2. Encounter for surveillance of injectable contraceptive Well on Depo-Provera.  No contraindication to continue.   Depo-Provera injection given today.  Prescription sent to the pharmacy with refills for the year.  3. Fibroids Stable asymptomatic uterine fibroids.  Will observe.  4. Class 1 obesity due to excess calories without serious comorbidity with body mass index (BMI) of 34.0 to 34.9 in adult Recommend a lower calorie/carb diet such as [employer].  Aerobic activities 5 times a week with light weightlifting every 2 days.  Other orders - medroxyPROGESTERone (DEPO-PROVERA) 150 MG/ML injection; Inject 1 mL (150 mg total) into the muscle every 3 (three) months.  Princess Bruins MD, 11:05 AM 06/19/2019

## 2019-06-19 NOTE — Patient Instructions (Signed)
  1. Encounter for routine gynecological examination with Papanicolaou smear of cervix Gynecologic exam with stable uterine fibroids.  Pap reflex done.  Breast exam normal.  Screening mammogram done today at the breast center requires further imaging of the right breast with a right diagnostic mammogram and ultrasound.  Health labs with family physician.  Anxiety associated with new diagnosis of chronic hypertension under treatment currently.  Natural ways to deal with anxiety reviewed including physical activity, yoga, meditation and herbal tea for anxiety.  2. Encounter for surveillance of injectable contraceptive Well on Depo-Provera.  No contraindication to continue.  Depo-Provera injection given today.  Prescription sent to the pharmacy with refills for the year.  3. Fibroids Stable asymptomatic uterine fibroids.  Will observe.  4. Class 1 obesity due to excess calories without serious comorbidity with body mass index (BMI) of 34.0 to 34.9 in adult Recommend a lower calorie/carb diet such as [employer].  Aerobic activities 5 times a week with light weightlifting every 2 days.  Other orders - medroxyPROGESTERone (DEPO-PROVERA) 150 MG/ML injection; Inject 1 mL (150 mg total) into the muscle every 3 (three) months.  Laurie Cervantes, it was a pleasure seeing you today!  I will inform you of your results as soon as they are available.

## 2019-06-19 NOTE — Addendum Note (Signed)
Addended by: Thurnell Garbe A on: 06/19/2019 11:59 AM   Modules accepted: Orders

## 2019-06-21 LAB — PAP IG W/ RFLX HPV ASCU

## 2019-07-04 ENCOUNTER — Ambulatory Visit
Admission: RE | Admit: 2019-07-04 | Discharge: 2019-07-04 | Disposition: A | Discharge status: Home / Self Care | Payer: 59 | Source: Ambulatory Visit | Attending: Obstetrics & Gynecology | Admitting: Obstetrics & Gynecology

## 2019-07-04 ENCOUNTER — Other Ambulatory Visit: Payer: Self-pay

## 2019-07-04 DIAGNOSIS — N6310 Unspecified lump in the right breast, unspecified quadrant: Secondary | ICD-10-CM

## 2019-07-19 ENCOUNTER — Emergency Department (HOSPITAL_COMMUNITY)
Admission: EM | Admit: 2019-07-19 | Discharge: 2019-07-19 | Disposition: A | Discharge status: Home / Self Care | Payer: 59 | Attending: Emergency Medicine | Admitting: Emergency Medicine

## 2019-07-19 ENCOUNTER — Encounter (HOSPITAL_COMMUNITY): Payer: Self-pay | Admitting: Emergency Medicine

## 2019-07-19 ENCOUNTER — Emergency Department (HOSPITAL_COMMUNITY): Payer: 59

## 2019-07-19 ENCOUNTER — Other Ambulatory Visit: Payer: Self-pay

## 2019-07-19 DIAGNOSIS — R519 Headache, unspecified: Secondary | ICD-10-CM | POA: Insufficient documentation

## 2019-07-19 DIAGNOSIS — Z79899 Other long term (current) drug therapy: Secondary | ICD-10-CM | POA: Diagnosis not present

## 2019-07-19 DIAGNOSIS — I10 Essential (primary) hypertension: Secondary | ICD-10-CM | POA: Diagnosis not present

## 2019-07-19 DIAGNOSIS — Z87891 Personal history of nicotine dependence: Secondary | ICD-10-CM | POA: Insufficient documentation

## 2019-07-19 NOTE — ED Triage Notes (Signed)
Per pt, states she has stopped drinking caffeine drinks-since then, she has been having headaches on and off-BP has been elevated as well-not sure if BP meds working

## 2019-07-19 NOTE — ED Provider Notes (Signed)
[facility] DEPT Provider Note   CSN: [id number] Arrival date & time: 07/19/19  [id number]     History Chief Complaint  Patient presents with  . Hypertension    Laurie Cervantes is a 49 y.o. female.  49 year old female presents with headache times several weeks.  Patient states that she has had trouble sleeping and does work many jobs.  Also notes that she is try to cut back on her caffeine use.  States that the headaches are occipital in nature and seem to come and go.  They are not associated with focal neurological findings.  No visual changes with it.  No neck pain or fever.  No treatment use prior to arrival        Past Medical History:  Diagnosis Date  . Bacterial vaginosis   . 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 Hypertension     Patient Active Problem List   Diagnosis Date Noted  . Essential hypertension 04/29/2019  . Tobacco abuse 04/29/2019  . 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 HAND SURGERY Left 2016   approximate date  . TUBAL LIGATION     PPTL     OB History    Gravida  3   Para  2   Term  2   Preterm      AB  1   Living  2     SAB  1   TAB      Ectopic      Multiple      Live Births  2           Family History  Problem Relation Age of Onset  . Hypertension Mother   . Diabetes Mellitus II Mother   . Breast cancer Maternal Aunt   . Hypertension Brother     Social History   Tobacco Use  . Smoking status: Former Smoker    Types: Cigarettes  . Smokeless tobacco: Never Used  Substance Use Topics  . Alcohol use: No  . Drug use: No    Home Medications Prior to Admission medications   Medication Sig Start Date End Date Taking? Authorizing Provider  acetaminophen (TYLENOL) 500 MG tablet Take 500 mg by mouth as needed for headache.   Yes [provider]  fluticasone (FLONASE) 50 MCG/ACT nasal spray Place 2  sprays into both nostrils daily. 06/12/19  Yes Roma Schanz R, DO  levocetirizine (XYZAL) 5 MG tablet Take 1 tablet (5 mg total) by mouth every evening. 06/12/19  Yes Ann Held, DO  medroxyPROGESTERone (DEPO-PROVERA) 150 MG/ML injection Inject 1 mL (150 mg total) into the muscle every 3 (three) months. 06/19/19  Yes Princess Bruins, MD  triamterene-hydrochlorothiazide (DYAZIDE) 37.5-25 MG capsule Take 1 each (1 capsule total) by mouth daily. 06/11/19  Yes Debbrah Alar, NP    Allergies    Patient has no known allergies.  Review of Systems   Review of Systems  All other systems reviewed and are negative.   Physical Exam Updated Vital Signs BP (!) 159/87 (BP Location: Left Arm)   Pulse (!) 102   Temp 98.8 F (37.1 C) (Oral)   Resp 16   Ht 1.676 m (5\' 6" )   Wt 93.9 kg   SpO2 100%   BMI 33.41 kg/m   Physical Exam Vitals and nursing note reviewed.  Constitutional:      General: She  is not in acute distress.    Appearance: Normal appearance. She is well-developed. She is not toxic-appearing.  HENT:     Head: Normocephalic and atraumatic.  Eyes:     General: Lids are normal.     Conjunctiva/sclera: Conjunctivae normal.     Pupils: Pupils are equal, round, and reactive to light.  Neck:     Thyroid: No thyroid mass.     Trachea: No tracheal deviation.  Cardiovascular:     Rate and Rhythm: Normal rate and regular rhythm.     Heart sounds: Normal heart sounds. No murmur. No gallop.   Pulmonary:     Effort: Pulmonary effort is normal. No respiratory distress.     Breath sounds: Normal breath sounds. No stridor. No decreased breath sounds, wheezing, rhonchi or rales.  Abdominal:     General: Bowel sounds are normal. There is no distension.     Palpations: Abdomen is soft.     Tenderness: There is no abdominal tenderness. There is no rebound.  Musculoskeletal:        General: No tenderness. Normal range of motion.     Cervical back: Normal range of motion  and neck supple.  Skin:    General: Skin is warm and dry.     Findings: No abrasion or rash.  Neurological:     General: No focal deficit present.     Mental Status: She is alert and oriented to person, place, and time.     GCS: GCS eye subscore is 4. GCS verbal subscore is 5. GCS motor subscore is 6.     Cranial Nerves: No cranial nerve deficit.     Sensory: No sensory deficit.     Motor: Motor function is intact.     Coordination: Coordination is intact.     Gait: Gait normal.  Psychiatric:        Speech: Speech normal.        Behavior: Behavior normal.     ED Results / Procedures / Treatments   Labs (all labs ordered are listed, but only abnormal results are displayed) Labs Reviewed - No data to display  EKG None  Radiology No results found.  Procedures Procedures (including critical care time)  Medications Ordered in ED Medications - No data to display  ED Course  I have reviewed the triage vital signs and the nursing notes.  Pertinent labs & imaging results that were available during my care of the patient were reviewed by me and considered in my medical decision making (see chart for details).    MDM Rules/Calculators/A&P                     Patient negative head CT here.  Blood pressure noted.  Patient counseled on lifestyle changes to help with her blood pressure.  Instructed to follow-up with her doctor Final Clinical Impression(s) / ED Diagnoses Final diagnoses:  None    Rx / DC Orders ED Discharge Orders    None       [name], MD 07/19/19 1124

## 2019-07-22 ENCOUNTER — Telehealth: Payer: Self-pay | Admitting: Family

## 2019-07-22 NOTE — Telephone Encounter (Signed)
done

## 2019-07-22 NOTE — Telephone Encounter (Signed)
Please contact pt to schedule ER follow up visit with me.

## 2019-07-23 ENCOUNTER — Encounter: Payer: Self-pay | Admitting: Family

## 2019-07-23 ENCOUNTER — Ambulatory Visit (INDEPENDENT_AMBULATORY_CARE_PROVIDER_SITE_OTHER): Payer: 59 | Admitting: Family

## 2019-07-23 ENCOUNTER — Other Ambulatory Visit: Payer: Self-pay

## 2019-07-23 VITALS — BP 133/73 | HR 97 | Temp 97.3°F | Resp 16 | Ht 66.0 in | Wt 201.0 lb

## 2019-07-23 DIAGNOSIS — I10 Essential (primary) hypertension: Secondary | ICD-10-CM

## 2019-07-23 DIAGNOSIS — J302 Other seasonal allergic rhinitis: Secondary | ICD-10-CM | POA: Diagnosis not present

## 2019-07-23 DIAGNOSIS — R42 Dizziness and giddiness: Secondary | ICD-10-CM | POA: Diagnosis not present

## 2019-07-23 DIAGNOSIS — G44209 Tension-type headache, unspecified, not intractable: Secondary | ICD-10-CM | POA: Diagnosis not present

## 2019-07-23 MED ORDER — MECLIZINE HCL 25 MG PO TABS: 25.0000 mg | ORAL_TABLET | Freq: Three times a day (TID) | ORAL | 0 refills | Status: DC | PRN

## 2019-07-23 NOTE — Progress Notes (Signed)
Subjective:    Patient ID: Laurie Cervantes, female    DOB: Jun 24, 1970, 49 y.o.   MRN: [id number]  HPI  Patient is a 49 yr old female who presents today for hospital follow up. ED record is reviewed.  She was evaluated in the ED on 07/19/19.  She underwent a CT head which was unremarkable.  BP was noted to be mildly elevated at that time.   BP Readings from Last 3 Encounters:  07/23/19 133/73  07/19/19 (!) 148/80  06/19/19 138/86   Reports that she felt tightness in the back of her head on the night that she went to the ED. Today she reports that she feels a bit off balance when she tries to walk.    Wakes up with pressure in her head. She reports no tightness in her neck today.   No HA currently. HA has been less severe since ED visit. Off and On.   She states that she has been drinking pom juice off/on.    Review of Systems Past Medical History:  Diagnosis Date  . Bacterial vaginosis   . 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 Hypertension      Social History   Socioeconomic History  . Marital status: Single    Spouse name: Not on file  . Number of children: Not on file  . Years of education: Not on file  . Highest education level: Not on file  Occupational History  . Not on file  Tobacco Use  . Smoking status: Former Smoker    Types: Cigarettes  . Smokeless tobacco: Never Used  Substance and Sexual Activity  . Alcohol use: No  . Drug use: No  . Sexual activity: Not Currently  Other Topics Concern  . Not on file  Social History Narrative   Work [employer]   2 grown sons   DIvorced   Enjoys shopping   No pets.    Social Determinants of Health   Financial Resource Strain:   . Difficulty of Paying Living Expenses:   Food Insecurity:   . Worried About Charity fundraiser in the Last Year:   . Arboriculturist in the Last Year:   Transportation Needs:   . Film/video editor (Medical):   Marland Kitchen Lack of Transportation  (Non-Medical):   Physical Activity:   . Days of Exercise per Week:   . Minutes of Exercise per Session:   Stress:   . Feeling of Stress :   Social Connections:   . Frequency of Communication with Friends and Family:   . Frequency of Social Gatherings with Friends and Family:   . Attends Religious Services:   . Active Member of Clubs or Organizations:   . Attends Archivist Meetings:   Marland Kitchen Marital Status:   Intimate Partner Violence:   . Fear of Current or Ex-Partner:   . Emotionally Abused:   Marland Kitchen Physically Abused:   . Sexually Abused:     Past Surgical History:  Procedure Laterality Date  . HAND SURGERY Left 2016   approximate date  . TUBAL LIGATION     PPTL    Family History  Problem Relation Age of Onset  . Hypertension Mother   . Diabetes Mellitus II Mother   . Breast cancer Maternal Aunt   . Hypertension Brother     No Known Allergies  Current Outpatient Medications on File Prior to Visit  Medication Sig Dispense Refill  .  acetaminophen (TYLENOL) 500 MG tablet Take 500 mg by mouth as needed for headache.    . fluticasone (FLONASE) 50 MCG/ACT nasal spray Place 2 sprays into both nostrils daily. 16 g 6  . levocetirizine (XYZAL) 5 MG tablet Take 1 tablet (5 mg total) by mouth every evening. 30 tablet 5  . medroxyPROGESTERone (DEPO-PROVERA) 150 MG/ML injection Inject 1 mL (150 mg total) into the muscle every 3 (three) months. 1 mL 4  . triamterene-hydrochlorothiazide (DYAZIDE) 37.5-25 MG capsule Take 1 each (1 capsule total) by mouth daily. 90 capsule 1   No current facility-administered medications on file prior to visit.    BP 133/73 (BP Location: Right Arm, Patient Position: Sitting, Cuff Size: Large)   Pulse 97   Temp (!) 97.3 F (36.3 C) (Temporal)   Resp 16   Ht 5\' 6"  (1.676 m)   Wt 201 lb (91.2 kg)   SpO2 100%   BMI 32.44 kg/m       Objective:   Physical Exam Constitutional:      Appearance: She is well-developed.  HENT:     Right Ear:  Tympanic membrane and ear canal normal.     Left Ear: Tympanic membrane and ear canal normal.  Eyes:     Extraocular Movements: Extraocular movements intact.     Pupils: Pupils are equal, round, and reactive to light.  Neck:     Thyroid: No thyromegaly.  Cardiovascular:     Rate and Rhythm: Normal rate and regular rhythm.     Heart sounds: Normal heart sounds. No murmur.  Pulmonary:     Effort: Pulmonary effort is normal. No respiratory distress.     Breath sounds: Normal breath sounds. No wheezing.  Musculoskeletal:     Cervical back: Neck supple.  Skin:    General: Skin is warm and dry.  Neurological:     Mental Status: She is alert and oriented to person, place, and time.     Comments: Mildly + Dix- Hallpike maneuver on right. Negative on left.   Psychiatric:        Behavior: Behavior normal.        Thought Content: Thought content normal.        Judgment: Judgment normal.           Assessment & Plan:  Headache- improved. Sounds like tension HA. We discussed heating pad to neck prn, massage, call if symptoms worsen.  Vertigo- mild. Trial of prn meclizine. If symptoms worsen or fail to improve consider referral for vestibular rehab.  HTN- bp is improved. Continue dyazide.  Seasonal allergies- She recently restarted flonase and xyzal. I recommended that she continue these medications.  This visit occurred during the SARS-CoV-2 public health emergency.  Safety protocols were in place, including screening questions prior to the visit, additional usage of staff PPE, and extensive cleaning of exam room while observing appropriate contact time as indicated for disinfecting solutions.

## 2019-07-23 NOTE — Patient Instructions (Signed)
Please continue xyzal and flonase. You may use meclizine as needed for dizziness.  Please call if dizziness or headaches worsen or if not improved in 1 week.

## 2019-08-02 ENCOUNTER — Telehealth: Payer: Self-pay

## 2019-08-02 NOTE — Telephone Encounter (Signed)
Patient called in to get a prescription refill for meclizine (ANTIVERT) 25 MG tablet [id number]   [facility] send it to [facility] [place].  [address] [place] [address]  Phone:  [phone] Fax:  [phone]  DEA #:  --

## 2019-08-04 ENCOUNTER — Encounter (HOSPITAL_COMMUNITY): Payer: Self-pay | Admitting: Emergency Medicine

## 2019-08-04 ENCOUNTER — Emergency Department (HOSPITAL_COMMUNITY)
Admission: EM | Admit: 2019-08-04 | Discharge: 2019-08-05 | Disposition: A | Discharge status: Home / Self Care | Payer: 59 | Attending: Emergency Medicine | Admitting: Emergency Medicine

## 2019-08-04 ENCOUNTER — Other Ambulatory Visit: Payer: Self-pay

## 2019-08-04 DIAGNOSIS — I10 Essential (primary) hypertension: Secondary | ICD-10-CM

## 2019-08-04 DIAGNOSIS — N390 Urinary tract infection, site not specified: Secondary | ICD-10-CM | POA: Diagnosis not present

## 2019-08-04 DIAGNOSIS — R519 Headache, unspecified: Secondary | ICD-10-CM | POA: Diagnosis not present

## 2019-08-04 DIAGNOSIS — R319 Hematuria, unspecified: Secondary | ICD-10-CM

## 2019-08-04 DIAGNOSIS — Z87891 Personal history of nicotine dependence: Secondary | ICD-10-CM | POA: Insufficient documentation

## 2019-08-04 LAB — CBC WITH DIFFERENTIAL/PLATELET
Abs Immature Granulocytes: 0.03 10*3/uL (ref 0.00–0.07)
Basophils Absolute: 0 10*3/uL (ref 0.0–0.1)
Basophils Relative: 0 %
Eosinophils Absolute: 0.1 10*3/uL (ref 0.0–0.5)
Eosinophils Relative: 1 %
HCT: 39.6 % (ref 36.0–46.0)
Hemoglobin: 13.1 g/dL (ref 12.0–15.0)
Immature Granulocytes: 0 %
Lymphocytes Relative: 36 %
Lymphs Abs: 3.3 10*3/uL (ref 0.7–4.0)
MCH: 28.4 pg (ref 26.0–34.0)
MCHC: 33.1 g/dL (ref 30.0–36.0)
MCV: 85.9 fL (ref 80.0–100.0)
Monocytes Absolute: 0.9 10*3/uL (ref 0.1–1.0)
Monocytes Relative: 10 %
Neutro Abs: 4.8 10*3/uL (ref 1.7–7.7)
Neutrophils Relative %: 53 %
Platelets: 373 10*3/uL (ref 150–400)
RBC: 4.61 MIL/uL (ref 3.87–5.11)
RDW: 12.1 % (ref 11.5–15.5)
WBC: 9.2 10*3/uL (ref 4.0–10.5)
nRBC: 0 % (ref 0.0–0.2)

## 2019-08-04 LAB — I-STAT BETA HCG BLOOD, ED (MC, WL, AP ONLY): I-stat hCG, quantitative: <5 m[IU]/mL (ref <5)

## 2019-08-04 LAB — URINALYSIS, ROUTINE W REFLEX MICROSCOPIC
Bilirubin Urine: NEGATIVE
Glucose, UA: NEGATIVE mg/dL
Ketones, ur: NEGATIVE mg/dL
Leukocytes,Ua: NEGATIVE
Nitrite: NEGATIVE
Protein, ur: NEGATIVE mg/dL
Specific Gravity, Urine: 1.006 (ref 1.005–1.030)
pH: 5 (ref 5.0–8.0)

## 2019-08-04 LAB — COMPREHENSIVE METABOLIC PANEL
ALT: 12 U/L (ref 0–44)
AST: 12 U/L — ABNORMAL LOW (ref 15–41)
Albumin: 3.8 g/dL (ref 3.5–5.0)
Alkaline Phosphatase: 55 U/L (ref 38–126)
Anion gap: 13 (ref 5–15)
BUN: 6 mg/dL (ref 6–20)
CO2: 22 mmol/L (ref 22–32)
Calcium: 9.5 mg/dL (ref 8.9–10.3)
Chloride: 105 mmol/L (ref 98–111)
Creatinine, Ser: 0.79 mg/dL (ref 0.44–1.00)
GFR calc Af Amer: >60 mL/min (ref >60)
GFR calc non Af Amer: >60 mL/min (ref >60)
Glucose, Bld: 121 mg/dL — ABNORMAL HIGH (ref 70–99)
Potassium: 3.4 mmol/L — ABNORMAL LOW (ref 3.5–5.1)
Sodium: 140 mmol/L (ref 135–145)
Total Bilirubin: 0.3 mg/dL (ref 0.3–1.2)
Total Protein: 7 g/dL (ref 6.5–8.1)

## 2019-08-04 MED ORDER — MECLIZINE HCL 25 MG PO TABS
25.0000 mg | ORAL_TABLET | Freq: Once | ORAL | Status: AC
  Administered 2019-08-05: 25 mg via ORAL
  Filled 2019-08-04: qty 1

## 2019-08-04 NOTE — ED Provider Notes (Signed)
Emergency Department Provider Note  I have reviewed the triage vital signs and the nursing notes.  HISTORY  Chief Complaint Hypertension   HPI Laurie Cervantes is a 49 y.o. female with multiple medical problems as documented below to include hypertension and recently diagnosed vertigo the presents to the emergency department today secondary to hypertension.  Patient states that she diagnosed with vertigo couple weeks ago in the emergency room follow-up with her doctor started on meclizine and had improvement.  She went off her meclizine on Friday and her headache came back which made her blood pressure worse and now she is having mild chest pain because of all of this.  She thinks that it could be related to anxiety versus her high blood pressure.  Her mother told her not to worry about it but she presents here for blood pressure concerns.  No worsening headache, neurologic changes, shortness of breath, vision changes or other associated symptoms.  No other recent illnesses.   No other associated or modifying symptoms.    Past Medical History:  Diagnosis Date  . Bacterial vaginosis   . 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 Hypertension     Patient Active Problem List   Diagnosis Date Noted  . Essential hypertension 04/29/2019  . Tobacco abuse 04/29/2019  . 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 HAND SURGERY Left 2016   approximate date  . TUBAL LIGATION     PPTL    Current Outpatient Rx  . Order #: [id number] Class: Historical Med  . Order #: [id number] Class: Normal  . Order #: [id number] Class: Normal  . Order #: [id number] Class: Normal  . Order #: [id number] Class: Normal  . Order #: [id number] Class: Normal    Allergies Patient has no known allergies.  Family History  Problem Relation Age of Onset  . Hypertension Mother   . Diabetes Mellitus II Mother   . Breast cancer Maternal Aunt   .  Hypertension Brother     Social History Social History   Tobacco Use  . Smoking status: Former Smoker    Types: Cigarettes  . Smokeless tobacco: Never Used  Substance Use Topics  . Alcohol use: No  . Drug use: No    Review of Systems  All other systems negative except as documented in the HPI. All pertinent positives and negatives as reviewed in the HPI. ____________________________________________  PHYSICAL EXAM:  VITAL SIGNS: ED Triage Vitals  Enc Vitals Group     BP 08/04/19 1939 (!) 161/104     Pulse Rate 08/04/19 1939 94     Resp 08/04/19 1939 16     Temp 08/04/19 1939 98.5 F (36.9 C)     Temp Source 08/04/19 1939 Oral     SpO2 08/04/19 1939 100 %     Weight 08/04/19 1939 199 lb (90.3 kg)     Height 08/04/19 1939 5\' 6"  (1.676 m)    Constitutional: Alert and oriented. Well appearing and in no acute distress. Eyes: Conjunctivae are normal. PERRL. EOMI. Head: Atraumatic. Nose: No congestion/rhinnorhea. Mouth/Throat: Mucous membranes are moist.  Oropharynx non-erythematous. Neck: No stridor.  No meningeal signs.   Cardiovascular: Normal rate, regular rhythm. Good peripheral circulation. Grossly normal heart sounds.   Respiratory: Normal respiratory effort.  No retractions. Lungs CTAB. Gastrointestinal: Soft and nontender. No distention.  Musculoskeletal: No lower extremity tenderness nor edema. No gross deformities of extremities. Neurologic:  Normal speech and  language. No gross focal neurologic deficits are appreciated. No altered mental status, able to give full seemingly accurate history.  Face is symmetric, EOM's intact, pupils equal and reactive, vision intact, tongue and uvula midline without deviation. Upper and Lower extremity motor 5/5, intact pain perception in distal extremities, 2+ reflexes in biceps, patella and achilles tendons.  Skin:  Skin is warm, dry and intact. No rash noted.  ____________________________________________   LABS (all labs  ordered are listed, but only abnormal results are displayed)  Labs Reviewed  URINALYSIS, ROUTINE W REFLEX MICROSCOPIC - Abnormal; Notable for the following components:      Result Value   APPearance HAZY (*)    Hgb urine dipstick MODERATE (*)    Bacteria, UA FEW (*)    All other components within normal limits  COMPREHENSIVE METABOLIC PANEL - Abnormal; Notable for the following components:   Potassium 3.4 (*)    Glucose, Bld 121 (*)    AST 12 (*)    All other components within normal limits  URINE CULTURE  CBC WITH DIFFERENTIAL/PLATELET  I-STAT BETA HCG BLOOD, ED ([facility], [facility], [facility] ONLY)   ____________________________________________  EKG    My ECG Read Indication:chest pain EKG was personally contemporaneously reviewed by myself. Rate: 84 PR Interval: 149 QRS duration: 81 QT/QTC: 364/431 Axis: normal EKG: normal EKG, normal sinus rhythm, there are no previous tracings available for comparison. Other significant findings: none  ____________________________________________  RADIOLOGY  No results found. ____________________________________________  PROCEDURES  Procedure(s) performed:   Procedures ____________________________________________  INITIAL IMPRESSION / ASSESSMENT AND PLAN / ED COURSE   This patient presents to the ED for concern of hypertension, this involves an extensive number of treatment options, and is a complaint that carries with it a high risk of complications and morbidity.  The differential diagnosis includes hypertensive emergency, ACS, renal dysfunction, vertigo.  Lab Tests:   I Ordered, reviewed, and interpreted labs, which included CBC, CMP and urinalysis.  Urinalysis showed hemoglobin but no other evidence of infection so we will add a culture on.  If this is negative she will follow-up with her doctor for further management.  Medicines ordered:   I ordered medication meclizine for vertigo helps her symptoms and her blood  pressure  Imaging Studies ordered:   I independently visualized and interpreted imaging none indicated at the CT scan was done a couple weeks ago for exact same symptoms.  No indication for MRI at this time without demonstrable neurologic changes.  EKG done with her mild chest pain but the chest pain is more likely related anxiety EKG was fine.  Chest pain improved after I explained her diagnosis to her and her blood pressure improved so is hard to tell which one it was.  Additional history obtained:   Additional history obtained from no one  Previous records obtained and reviewed in epic  Consultations Obtained:   I consulted no one and discussed lab and imaging findings  Reevaluation:  After the interventions stated above, I reevaluated the patient and found significant improvement after meclizine.  Blood pressure improved to [id number] systolic.  Patient is asymptomatic at this time.  Critical Interventions:   A medical screening exam was performed and I feel the patient has had an appropriate workup for their chief complaint at this time and likelihood of emergent condition existing is low. They have been counseled on decision, discharge, follow up and which symptoms necessitate immediate return to the emergency department. They or their family verbally stated understanding and agreement  with plan and discharged in stable condition.   ____________________________________________  FINAL CLINICAL IMPRESSION(S) / ED DIAGNOSES  Final diagnoses:  Hypertension, unspecified type  Hematuria, unspecified type  Nonintractable episodic headache, unspecified headache type    MEDICATIONS GIVEN DURING THIS VISIT:  Medications  meclizine (ANTIVERT) tablet 25 mg (has no administration in time range)    NEW OUTPATIENT MEDICATIONS STARTED DURING THIS VISIT:  New Prescriptions   MECLIZINE (ANTIVERT) 25 MG TABLET    Take 1 tablet (25 mg total) by mouth 3 (three) times daily as needed for  dizziness.    Note:  This note was prepared with assistance of Dragon voice recognition software. Occasional wrong-word or sound-a-like substitutions may have occurred due to the inherent limitations of voice recognition software.   Alwaleed Obeso, Corene Cornea, MD 08/05/19 0001

## 2019-08-04 NOTE — ED Triage Notes (Signed)
Pt c/o HTN and vertigo for the past few weeks was taking medication for vertigo until last Friday, pt states she still not feeling good and is having a lot of fatigue. Pt states that her BP is going up since she is taking the vertigo medication.

## 2019-08-05 MED ORDER — MECLIZINE HCL 25 MG PO TABS
25.0000 mg | ORAL_TABLET | Freq: Three times a day (TID) | ORAL | 0 refills | Status: DC | PRN
Start: 2019-08-05 — End: 2019-08-30

## 2019-08-05 NOTE — ED Notes (Signed)
Pt verbalized understanding of d/c instructions, medications and follow up. Pt ambulatory to [facility] with steady gait. NAD

## 2019-08-06 ENCOUNTER — Ambulatory Visit (INDEPENDENT_AMBULATORY_CARE_PROVIDER_SITE_OTHER): Payer: 59 | Admitting: Family

## 2019-08-06 ENCOUNTER — Other Ambulatory Visit: Payer: Self-pay

## 2019-08-06 ENCOUNTER — Encounter: Payer: Self-pay | Admitting: Family

## 2019-08-06 VITALS — BP 144/87 | HR 106 | Temp 97.8°F | Resp 16 | Ht 66.0 in | Wt 206.0 lb

## 2019-08-06 DIAGNOSIS — I10 Essential (primary) hypertension: Secondary | ICD-10-CM

## 2019-08-06 DIAGNOSIS — F419 Anxiety disorder, unspecified: Secondary | ICD-10-CM

## 2019-08-06 DIAGNOSIS — R42 Dizziness and giddiness: Secondary | ICD-10-CM | POA: Diagnosis not present

## 2019-08-06 LAB — URINE CULTURE: Culture: 10000 — AB

## 2019-08-06 MED ORDER — METOPROLOL SUCCINATE ER 25 MG PO TB24
25.0000 mg | ORAL_TABLET | Freq: Every day | ORAL | 3 refills | Status: DC
Start: 2019-08-06 — End: 2019-08-16

## 2019-08-06 NOTE — Patient Instructions (Signed)
Please add metoprolol once daily for blood pressure. You should be contacted about scheduling your appointment for Vestibular PT for vertigo.

## 2019-08-06 NOTE — Progress Notes (Signed)
Subjective:    Patient ID: Osie Cheeks, female    DOB: Apr 20, 1970, 49 y.o.   MRN: [id number]  HPI  Patient is a 49 yr old female who presents today for ED  follow up. She presented on 5/30 with c/o HTN. ED record is reviewed.  EKG showed normal sinus rhythm.  Her blood pressure had improved prior to her discharge from the emergency department.  HTN- bp medication includes dyazide.  Home bp 161/102 BP Readings from Last 3 Encounters:  08/06/19 (!) 144/87  08/05/19 (!) 141/87  07/23/19 133/73   Vertigo- improved overall. Still "a little off" in the AM.  Notes some improvement.  Has been taking meclizine prn.    She has not worked in 35 month.  She is supposed to return to work tomorrow.  She does not feel that she is ready to return to work due to her ongoing dizziness.  She does note that she becomes very easily anxious, especially about her blood pressure.   Review of Systems See HPI  Past Medical History:  Diagnosis Date  . Bacterial vaginosis   . 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 Hypertension      Social History   Socioeconomic History  . Marital status: Single    Spouse name: Not on file  . Number of children: Not on file  . Years of education: Not on file  . Highest education level: Not on file  Occupational History  . Not on file  Tobacco Use  . Smoking status: Former Smoker    Types: Cigarettes  . Smokeless tobacco: Never Used  Substance and Sexual Activity  . Alcohol use: No  . Drug use: No  . Sexual activity: Not Currently  Other Topics Concern  . Not on file  Social History Narrative   Work [employer]er   2 grown sons   DIvorced   Enjoys shopping   No pets.    Social Determinants of Health   Financial Resource Strain:   . Difficulty of Paying Living Expenses:   Food Insecurity:   . Worried About Charity fundraiser in the Last Year:   . Arboriculturist in the Last Year:   Transportation Needs:   . Lexicographer (Medical):   Marland Kitchen Lack of Transportation (Non-Medical):   Physical Activity:   . Days of Exercise per Week:   . Minutes of Exercise per Session:   Stress:   . Feeling of Stress :   Social Connections:   . Frequency of Communication with Friends and Family:   . Frequency of Social Gatherings with Friends and Family:   . Attends Religious Services:   . Active Member of Clubs or Organizations:   . Attends Archivist Meetings:   Marland Kitchen Marital Status:   Intimate Partner Violence:   . Fear of Current or Ex-Partner:   . Emotionally Abused:   Marland Kitchen Physically Abused:   . Sexually Abused:     Past Surgical History:  Procedure Laterality Date  . HAND SURGERY Left 2016   approximate date  . TUBAL LIGATION     PPTL    Family History  Problem Relation Age of Onset  . Hypertension Mother   . Diabetes Mellitus II Mother   . Breast cancer Maternal Aunt   . Hypertension Brother     No Known Allergies  Current Outpatient Medications on File Prior to Visit  Medication Sig Dispense  Refill  . acetaminophen (TYLENOL) 500 MG tablet Take 500 mg by mouth as needed for headache.    . fluticasone (FLONASE) 50 MCG/ACT nasal spray Place 2 sprays into both nostrils daily. 16 g 6  . levocetirizine (XYZAL) 5 MG tablet Take 1 tablet (5 mg total) by mouth every evening. 30 tablet 5  . meclizine (ANTIVERT) 25 MG tablet Take 1 tablet (25 mg total) by mouth 3 (three) times daily as needed for dizziness. 30 tablet 0  . medroxyPROGESTERone (DEPO-PROVERA) 150 MG/ML injection Inject 1 mL (150 mg total) into the muscle every 3 (three) months. 1 mL 4  . triamterene-hydrochlorothiazide (DYAZIDE) 37.5-25 MG capsule Take 1 each (1 capsule total) by mouth daily. 90 capsule 1   No current facility-administered medications on file prior to visit.    BP (!) 144/87 (BP Location: Right Arm, Patient Position: Sitting, Cuff Size: Large)   Pulse (!) 106   Temp 97.8 F (36.6 C) (Temporal)   Resp 16    Ht 5\' 6"  (1.676 m)   Wt 206 lb (93.4 kg)   SpO2 100%   BMI 33.25 kg/m       Objective:   Physical Exam Constitutional:      Appearance: She is well-developed.  Cardiovascular:     Rate and Rhythm: Normal rate and regular rhythm.     Heart sounds: Normal heart sounds. No murmur.  Pulmonary:     Effort: Pulmonary effort is normal. No respiratory distress.     Breath sounds: Normal breath sounds. No wheezing.  Psychiatric:        Attention and Perception: Attention normal.        Mood and Affect: Mood is anxious.        Speech: Speech normal.        Behavior: Behavior normal.        Thought Content: Thought content normal.        Judgment: Judgment normal.           Assessment & Plan:  Hypertension-blood pressure remains above goal.  We will add metoprolol XL 25 mg once daily.  Continue current dose of Dyazide.  Vertigo-overall symptoms are improving, however she does not feel that she is currently stable to return to work with current level of dizziness.  I will make a referral to vestibular rehab.  Continue as needed meclizine.  Anxiety-GAD-7 is performed today and patient scored 8 on this evaluation.  She denies any significant anxiety symptoms outside of worrying about her blood pressure.  It is my hope that once we get her blood pressure under better control she will be less anxious.  If her anxiety fails to improve could consider addition of SSRI.  This visit occurred during the SARS-CoV-2 public health emergency.  Safety protocols were in place, including screening questions prior to the visit, additional usage of staff PPE, and extensive cleaning of exam room while observing appropriate contact time as indicated for disinfecting solutions.

## 2019-08-12 ENCOUNTER — Telehealth: Payer: Self-pay | Admitting: Family

## 2019-08-12 NOTE — Telephone Encounter (Signed)
Per patient she is not having symptoms at this tine, will hold off on refill, she has a follow up appointment this Friday 08-16-2019

## 2019-08-12 NOTE — Telephone Encounter (Signed)
Medication: meclizine (ANTIVERT) 25 MG tablet   Has the patient contacted their pharmacy? Yes.   (If no, request that the patient contact the pharmacy for the refill.) (If yes, when and what did the pharmacy advise?)  Preferred Pharmacy (with phone number or street name):   [place] [place].  [address] [place] [address]  Phone:  [phone] Fax:  [phone]   Agent: Please be advised that RX refills may take up to 3 business days. We ask that you follow-up with your pharmacy.

## 2019-08-16 ENCOUNTER — Telehealth: Payer: Self-pay | Admitting: Family

## 2019-08-16 ENCOUNTER — Encounter: Payer: Self-pay | Admitting: Family

## 2019-08-16 ENCOUNTER — Ambulatory Visit (INDEPENDENT_AMBULATORY_CARE_PROVIDER_SITE_OTHER): Payer: 59 | Admitting: Family

## 2019-08-16 ENCOUNTER — Other Ambulatory Visit: Payer: Self-pay

## 2019-08-16 VITALS — BP 122/75 | HR 79 | Temp 98.3°F | Resp 16 | Ht 66.0 in | Wt 206.0 lb

## 2019-08-16 DIAGNOSIS — I10 Essential (primary) hypertension: Secondary | ICD-10-CM

## 2019-08-16 DIAGNOSIS — R0789 Other chest pain: Secondary | ICD-10-CM

## 2019-08-16 DIAGNOSIS — F419 Anxiety disorder, unspecified: Secondary | ICD-10-CM | POA: Diagnosis not present

## 2019-08-16 DIAGNOSIS — R519 Headache, unspecified: Secondary | ICD-10-CM

## 2019-08-16 DIAGNOSIS — Z0279 Encounter for issue of other medical certificate: Secondary | ICD-10-CM

## 2019-08-16 DIAGNOSIS — E876 Hypokalemia: Secondary | ICD-10-CM

## 2019-08-16 LAB — BASIC METABOLIC PANEL
BUN: 15 mg/dL (ref 6–23)
CO2: 25 mEq/L (ref 19–32)
Calcium: 9.4 mg/dL (ref 8.4–10.5)
Chloride: 104 mEq/L (ref 96–112)
Creatinine, Ser: 0.84 mg/dL (ref 0.40–1.20)
GFR: 88.01 mL/min (ref >60.00)
Glucose, Bld: 86 mg/dL (ref 70–99)
Potassium: 3.4 mEq/L — ABNORMAL LOW (ref 3.5–5.1)
Sodium: 137 mEq/L (ref 135–145)

## 2019-08-16 MED ORDER — METOPROLOL SUCCINATE ER 25 MG PO TB24: 25.0000 mg | ORAL_TABLET | Freq: Every day | ORAL | 1 refills | Status: DC

## 2019-08-16 MED ORDER — POTASSIUM CHLORIDE ER 10 MEQ PO TBCR: 10.0000 meq | EXTENDED_RELEASE_TABLET | Freq: Every day | ORAL | 3 refills | Status: DC

## 2019-08-16 NOTE — Progress Notes (Signed)
Subjective:    Patient ID: Laurie Cervantes, female    DOB: 20-Apr-1970, 49 y.o.   MRN: [id number]  HPI  Patient is a 49 yr old female who presents today for follow up of her blood pressure. Last visit we added metoprolol 25mg  once daily to her dyazide.   BP Readings from Last 3 Encounters:  08/16/19 122/75  08/06/19 (!) 144/87  08/05/19 (!) 141/87   Today she tells me that she has been having intermittent chest pain which has been present since before her ER visit. Denies current chest pain.  Occurs intermittently, improve with deep breath. She has been out of work since 6/2 and plans to return to work on 08/19/19.   Headaches- improving.      Review of Systems Past Medical History:  Diagnosis Date   Bacterial vaginosis    Herpes genitalia    Type II Culture proven 03/2012   Hypertension      Social History   Socioeconomic History   Marital status: Single    Spouse name: Not on file   Number of children: Not on file   Years of education: Not on file   Highest education level: Not on file  Occupational History   Not on file  Tobacco Use   Smoking status: Former Smoker    Types: Cigarettes   Smokeless tobacco: Never Used  Vaping Use   Vaping Use: Never used  Substance and Sexual Activity   Alcohol use: No   Drug use: No   Sexual activity: Not Currently  Other Topics Concern   Not on file  Social History Narrative   Work [employer]er   2 grown sons   DIvorced   Enjoys shopping   No pets.    Social Determinants of Health   Financial Resource Strain:    Difficulty of Paying Living Expenses:   Food Insecurity:    Worried About Charity fundraiser in the Last Year:    Arboriculturist in the Last Year:   Transportation Needs:    Film/video editor (Medical):    Lack of Transportation (Non-Medical):   Physical Activity:    Days of Exercise per Week:    Minutes of Exercise per Session:   Stress:    Feeling of Stress :    Social Connections:    Frequency of Communication with Friends and Family:    Frequency of Social Gatherings with Friends and Family:    Attends Religious Services:    Active Member of Clubs or Organizations:    Attends Music therapist:    Marital Status:   Intimate Partner Violence:    Fear of Current or Ex-Partner:    Emotionally Abused:    Physically Abused:    Sexually Abused:     Past Surgical History:  Procedure Laterality Date   HAND SURGERY Left 2016   approximate date   TUBAL LIGATION     PPTL    Family History  Problem Relation Age of Onset   Hypertension Mother    Diabetes Mellitus II Mother    Breast cancer Maternal Aunt    Hypertension Brother     No Known Allergies  Current Outpatient Medications on File Prior to Visit  Medication Sig Dispense Refill   acetaminophen (TYLENOL) 500 MG tablet Take 500 mg by mouth as needed for headache.     fluticasone (FLONASE) 50 MCG/ACT nasal spray Place 2 sprays into both nostrils daily. 16 g 6  levocetirizine (XYZAL) 5 MG tablet Take 1 tablet (5 mg total) by mouth every evening. 30 tablet 5   meclizine (ANTIVERT) 25 MG tablet Take 1 tablet (25 mg total) by mouth 3 (three) times daily as needed for dizziness. 30 tablet 0   medroxyPROGESTERone (DEPO-PROVERA) 150 MG/ML injection Inject 1 mL (150 mg total) into the muscle every 3 (three) months. 1 mL 4   metoprolol succinate (TOPROL-XL) 25 MG 24 hr tablet Take 1 tablet (25 mg total) by mouth daily. 30 tablet 3   triamterene-hydrochlorothiazide (DYAZIDE) 37.5-25 MG capsule Take 1 each (1 capsule total) by mouth daily. 90 capsule 1   No current facility-administered medications on file prior to visit.    BP 122/75 (BP Location: Right Arm, Patient Position: Sitting, Cuff Size: Large)    Pulse 79    Temp 98.3 F (36.8 C) (Temporal)    Resp 16    Ht 5\' 6"  (1.676 m)    Wt 206 lb (93.4 kg)    SpO2 100%    BMI 33.25 kg/m       Objective:     Physical Exam Constitutional:      Appearance: She is well-developed.  Cardiovascular:     Rate and Rhythm: Normal rate and regular rhythm.     Heart sounds: Normal heart sounds. No murmur heard.   Pulmonary:     Effort: Pulmonary effort is normal. No respiratory distress.     Breath sounds: Normal breath sounds. No wheezing.  Psychiatric:        Behavior: Behavior normal.        Thought Content: Thought content normal.        Judgment: Judgment normal.           Assessment & Plan:  HTN- bp looks much better today. She has had some higher readings at home a few days back. I have advised her to continue to send me her readings via mychart. Continue current meds. Check follow up bmet.   Atypical Chest pain- she had EKG's performed on 5/30 and 6/1. Both EKG's noted NSR without acute changes.  I will refer to cardiology for further evaluation and reminded pt to return to the ED if she develops recurrent chest pain.  Anxiety- seems to be improving. She is looking forward to returning to work on Monday.  Headaches- improving. Will hold off on neurology referral. She will let me know if her HA's worsen and we can consider referral at a later date.  This visit occurred during the SARS-CoV-2 public health emergency.  Safety protocols were in place, including screening questions prior to the visit, additional usage of staff PPE, and extensive cleaning of exam room while observing appropriate contact time as indicated for disinfecting solutions.

## 2019-08-16 NOTE — Telephone Encounter (Signed)
Potassium is low. Please add Kdur 79meq once daily and repeat bmet in 1 week.

## 2019-08-16 NOTE — Telephone Encounter (Signed)
Patient advised of results and new rx °

## 2019-08-20 NOTE — Addendum Note (Signed)
Addended by: Debbrah Alar on: 08/20/2019 07:03 AM   Modules accepted: Orders

## 2019-08-21 ENCOUNTER — Telehealth: Payer: Self-pay | Admitting: Family

## 2019-08-21 DIAGNOSIS — R519 Headache, unspecified: Secondary | ICD-10-CM

## 2019-08-21 NOTE — Telephone Encounter (Signed)
Caller : Laurie Cervantes  Call Back # [phone]  Patient requesting a referral to neurologist for headaches. Patient states that spoke to [facility] about this before .   Please Advise

## 2019-08-22 ENCOUNTER — Encounter: Payer: Self-pay | Admitting: Neurology

## 2019-08-22 ENCOUNTER — Telehealth: Payer: Self-pay | Admitting: Family

## 2019-08-22 ENCOUNTER — Other Ambulatory Visit (INDEPENDENT_AMBULATORY_CARE_PROVIDER_SITE_OTHER): Payer: 59

## 2019-08-22 ENCOUNTER — Other Ambulatory Visit: Payer: Self-pay

## 2019-08-22 DIAGNOSIS — E876 Hypokalemia: Secondary | ICD-10-CM | POA: Diagnosis not present

## 2019-08-22 DIAGNOSIS — R519 Headache, unspecified: Secondary | ICD-10-CM

## 2019-08-22 NOTE — Telephone Encounter (Signed)
New order placed

## 2019-08-22 NOTE — Telephone Encounter (Signed)
Caller; Laurie Cervantes  Call Back # [phone]  Patient states referred to [facility] and they can not see her until September 2021. Patient would like to see someone sooner. Please refer to another Neurologist.

## 2019-08-22 NOTE — Telephone Encounter (Signed)
Pt is scheduled with [facility]. If she would like to see another provider I will need another referral.

## 2019-08-22 NOTE — Telephone Encounter (Signed)
Pt called back stating she wants to be referred to [facility] in hopes she can can be seen sooner than September.

## 2019-08-23 ENCOUNTER — Telehealth: Payer: Self-pay | Admitting: Family

## 2019-08-23 ENCOUNTER — Other Ambulatory Visit: Payer: Self-pay

## 2019-08-23 DIAGNOSIS — E876 Hypokalemia: Secondary | ICD-10-CM

## 2019-08-23 LAB — BASIC METABOLIC PANEL
BUN: 12 mg/dL (ref 6–23)
CO2: 25 mEq/L (ref 19–32)
Calcium: 9.4 mg/dL (ref 8.4–10.5)
Chloride: 106 mEq/L (ref 96–112)
Creatinine, Ser: 0.82 mg/dL (ref 0.40–1.20)
GFR: 90.49 mL/min (ref >60.00)
Glucose, Bld: 99 mg/dL (ref 70–99)
Potassium: 3.4 mEq/L — ABNORMAL LOW (ref 3.5–5.1)
Sodium: 139 mEq/L (ref 135–145)

## 2019-08-23 MED ORDER — POTASSIUM CHLORIDE CRYS ER 20 MEQ PO TBCR: 20.0000 meq | EXTENDED_RELEASE_TABLET | Freq: Every day | ORAL | 3 refills | Status: AC

## 2019-08-23 NOTE — Telephone Encounter (Signed)
Patient advised FMLA forms are ready, forms faxed to [employer] at her request, copy mailed to patient.  Advised of K results and porovider's advise to increase kdur. Patient verbalized understanding. Will go to [facility] in one week for bmet, orders entered,.

## 2019-08-23 NOTE — Telephone Encounter (Signed)
I have completed her FMLA.  Also, potassium is still low.  I would like to increase her kdur to 52meq and have her complete bmet in 1 week, dx hypokalemia.

## 2019-08-26 ENCOUNTER — Telehealth: Payer: Self-pay

## 2019-08-26 NOTE — Telephone Encounter (Signed)
Patient called in to speak with the nurse about her head aches. Please give the patient a call back as soon as possible at [phone]

## 2019-08-26 NOTE — Telephone Encounter (Signed)
Patient wanted to know if she can take some otc medication for headaches other than tylenol. Patient advised she can try ibuprofen or alive.

## 2019-08-28 ENCOUNTER — Telehealth: Payer: Self-pay | Admitting: Family

## 2019-08-28 NOTE — Telephone Encounter (Signed)
Patient called, patient states [employer] has not received FMLA paperwork yet. Please resend paperwork to [facility]. Patient states that [employer] will re fax documentation.

## 2019-08-29 ENCOUNTER — Other Ambulatory Visit: Payer: Self-pay

## 2019-08-29 ENCOUNTER — Encounter: Payer: Self-pay | Admitting: Physical Therapy

## 2019-08-29 ENCOUNTER — Ambulatory Visit: Payer: 59 | Attending: Family | Admitting: Physical Therapy

## 2019-08-29 DIAGNOSIS — R42 Dizziness and giddiness: Secondary | ICD-10-CM | POA: Insufficient documentation

## 2019-08-29 DIAGNOSIS — M542 Cervicalgia: Secondary | ICD-10-CM | POA: Insufficient documentation

## 2019-08-29 NOTE — Therapy (Signed)
[facility] [address]  [place], [place], [address] Phone: [phone]   Fax:  [phone]  Physical Therapy Evaluation  Patient Details  Name: Laurie Cervantes MRN: [id number] Date of Birth: 02-Feb-1971 Referring Provider (PT): Debbrah Alar, NP   Encounter Date: 08/29/2019   PT End of Session - 08/29/19 1717    Visit Number 1    Number of Visits 7    Date for PT Re-Evaluation 10/10/19    Authorization Type [employer]    PT Start Time 1618    PT Stop Time 1706    PT Time Calculation (min) 48 min    Activity Tolerance Patient tolerated treatment well    Behavior During Therapy [facility] for tasks assessed/performed           Past Medical History:  Diagnosis Date  . Bacterial vaginosis   . 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 Hypertension     Past Surgical History:  Procedure Laterality Date  . HAND SURGERY Left 2016   approximate date  . TUBAL LIGATION     PPTL    There were no vitals filed for this visit.    Subjective Assessment - 08/29/19 1620    Subjective Patient reports that she was diagnosed with vertigo on May 18th. Was given Meclizine- 1st dose was not enough as she went to the ED that following week for dizziness. Finished the 2nd dose and this seemed to resolve it. Initially had a sensation of "everything was moving and head was full of water." Fine when laying down, but when getting up from bed she felt woozy. Lasts ~5-10 minutes. 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 Recalls getting her 2nd COVID vaccine dose on 29th April 2nd dose vaccine but denies and recent infection/virus. Denies head trauma, accidents, or hearing loss. Does endorse tinnitus and B aural fullness. Has a hx of HAs and noticed them getting worse since having this vertigo. Also notes that she has started to have neck pain since being dizzy.     Pertinent History HTN, L hand surgery    Limitations House hold activities;Walking;Standing;Lifting    Diagnostic tests 07/19/19 CT head: Study within normal limits.    Patient Stated Goals get rid of dizziness    Currently in Pain? No/denies              [facility] PT Assessment - 08/29/19 1709      Assessment   Medical Diagnosis Vertigo    Referring Provider (PT) Debbrah Alar, NP    Onset Date/Surgical Date 07/23/19    Next MD Visit not scheduled    Prior Therapy no      Precautions   Precautions None      Balance Screen   Has the patient fallen in the past 6 months No    Has the patient had a decrease in activity level because of a fear of falling?  No    Is the patient reluctant to leave their home because of a fear of falling?  No      Prior Function   Level of Independence Independent    Vocation Full time employment      Cognition   Overall Cognitive Status Within Functional Limits for tasks assessed      Sensation   Light Touch Appears Intact      Coordination  Gross Motor Movements are Fluid and Coordinated Yes      Posture/Postural Control   Posture/Postural Control Postural limitations    Postural Limitations Rounded Shoulders;Forward head                  Vestibular Assessment - 08/29/19 0001      Symptom Behavior   Type of Dizziness  "Funny feeling in head"    Frequency of Dizziness several times daily    Duration of Dizziness 5-10 min    Symptom Nature Variable    Aggravating Factors Supine to sit;Turning head quickly;Moving eyes    Relieving Factors Medication    Progression of Symptoms Better      Oculomotor Exam   Oculomotor Alignment Normal    Ocular ROM WNL    Spontaneous Absent    Gaze-induced  Absent    Head shaking Horizontal --    Smooth Pursuits Intact    Saccades Intact   possible slight overshoot on down gaze   Comment convergence: WNL      Oculomotor Exam-Fixation Suppressed    Left Head Impulse WNL   fixation  not suppressed   Right Head Impulse WNL   fixation not suppressed     Vestibulo-Ocular Reflex   VOR 1 Head Only (x 1 viewing) difficulty maintaining gaze with R head turn, WNL with vertical VOE    VOR Cancellation Normal      Positional Testing   Dix-Hallpike Dix-Hallpike Right;Dix-Hallpike Left    Sidelying Test Sidelying Right;Sidelying Left    Horizontal Canal Testing Horizontal Canal Right;Horizontal Canal Left      Dix-Hallpike Right   Dix-Hallpike Right Symptoms No nystagmus      Dix-Hallpike Left   Dix-Hallpike Left Symptoms No nystagmus      Sidelying Right   Sidelying Right Symptoms --   c/o slight dizziness upon sitting     Sidelying Left   Sidelying Left Symptoms No nystagmus              Objective measurements completed on examination: See above findings.               PT Education - 08/29/19 1716    Education Details prognosis, POC, HEP-Access Code: [email or web address]; edu on benefit of vestibular rehab and common vestibular disorders    Person(s) Educated Patient    Methods Explanation;Demonstration;Tactile cues;Verbal cues;Handout    Comprehension Verbalized understanding;Returned demonstration            PT Short Term Goals - 08/29/19 1729      PT SHORT TERM GOAL #1   Title Patient to be independent with initial HEP.    Time 2    Period Weeks    Status New    Target Date 09/12/19             PT Long Term Goals - 08/29/19 1730      PT LONG TERM GOAL #1   Title Patient to be independent with advanced HEP.    Time 6    Period Weeks    Status New    Target Date 10/10/19      PT LONG TERM GOAL #2   Title Patient to report 0/10 dizziness with Nestor Lewandowsky with EO and EC.    Time 6    Period Weeks    Status New    Target Date 10/10/19      PT LONG TERM GOAL #3   Title Patient to report 100% resolution of dizziness with work activities.  Time 6    Period Weeks    Status New    Target Date 10/10/19      PT LONG TERM GOAL #4    Title Patient to score >22/30 on FGA in order to decrease risk of falls.    Time 6    Period Weeks    Status New    Target Date 10/10/19      PT LONG TERM GOAL #5   Title Patient to report no remaining neck pain with quick head turns.    Time 6    Period Weeks    Status New    Target Date 10/10/19                  Plan - 08/29/19 1718    Clinical Impression Statement Patient is a 48y/o F presenting to OPPT with c/o dizziness since May 2021. Reports a feeling of being "woozy" and endorses tinnitus and B aural fullness. Denies recent infection/virus, head trauma, accidents, or hearing loss. Episodes last 5-10 minutes and worse when sitting up out of bed and with quick head turns. Since initial onset, reports improvement with Meclizine. Oculomotor assessment revealed possible overshoot with down-gaze saccades and difficulty maintaining gaze fixation with R horizontal VOR. Patient slightly asymptomatic when sitting up from [facility]. Educated patient on VOR and strength cervical stretching HEP. Patient reported understanding. Would benefit from skilled PT services 1x/week or biweekly as needed for dizziness.    Personal Factors and Comorbidities Age;Comorbidity 1;Time since onset of injury/illness/exacerbation;Profession;Past/Current Experience    Comorbidities HTN, L hand surgery    Examination-Activity Limitations Bed Mobility;Sleep;Bend;Stairs;Lift;Reach Overhead;Transfers    Examination-Participation Restrictions Church;Cleaning;Shop;Community Activity;Driving;Yard Work;Meal Prep;Laundry    Stability/Clinical Decision Making Stable/Uncomplicated    Clinical Decision Making Low    Rehab Potential Good    PT Frequency Other (comment)   1x/week or biweekly as needed   PT Duration 6 weeks    PT Treatment/Interventions ADLs/Self Care Home Management;Canalith Repostioning;Moist Heat;Cryotherapy;Lobbyist;Therapeutic exercise;Therapeutic  activities;Functional mobility training;Stair training;Gait training;Ultrasound;Neuromuscular re-education;Patient/family education;Manual techniques;Vestibular;Taping;Energy conservation;Dry needling;Passive range of motion    PT Next Visit Plan check orthostatics, FGA, assess neck ROM    Consulted and Agree with Plan of Care Patient           Patient will benefit from skilled therapeutic intervention in order to improve the following deficits and impairments:  Decreased activity tolerance, Pain, Decreased balance, Difficulty walking, Improper body mechanics, Decreased range of motion, Postural dysfunction, Impaired flexibility, Dizziness  Visit Diagnosis: Dizziness and giddiness  Cervicalgia     Problem List Patient Active Problem List   Diagnosis Date Noted  . Essential hypertension 04/29/2019  . Tobacco abuse 04/29/2019  . Herpes simplex type 2 infection 04/29/2019    Janene Harvey, PT, DPT 08/29/19 5:34 PM   [facility] [address]  [place], [place], [address] Phone: [phone]   Fax:  [phone]  Name: Laurie Cervantes MRN: [id number] Date of Birth: 1970/11/26

## 2019-08-30 ENCOUNTER — Encounter: Payer: Self-pay | Admitting: Cardiology

## 2019-08-30 ENCOUNTER — Telehealth: Payer: Self-pay | Admitting: Family

## 2019-08-30 ENCOUNTER — Ambulatory Visit (INDEPENDENT_AMBULATORY_CARE_PROVIDER_SITE_OTHER): Payer: 59 | Admitting: Cardiology

## 2019-08-30 ENCOUNTER — Other Ambulatory Visit: Payer: Self-pay | Admitting: Family

## 2019-08-30 VITALS — BP 150/80 | HR 80 | Ht 66.0 in | Wt 211.0 lb

## 2019-08-30 DIAGNOSIS — R011 Cardiac murmur, unspecified: Secondary | ICD-10-CM | POA: Diagnosis not present

## 2019-08-30 DIAGNOSIS — R0789 Other chest pain: Secondary | ICD-10-CM | POA: Insufficient documentation

## 2019-08-30 DIAGNOSIS — Z720 Tobacco use: Secondary | ICD-10-CM | POA: Diagnosis not present

## 2019-08-30 DIAGNOSIS — I10 Essential (primary) hypertension: Secondary | ICD-10-CM

## 2019-08-30 DIAGNOSIS — R072 Precordial pain: Secondary | ICD-10-CM

## 2019-08-30 NOTE — Patient Instructions (Signed)
Medication Instructions:  No medication changes. *If you need a refill on your cardiac medications before your next appointment, please call your pharmacy*   Lab Work: None ordered If you have labs (blood work) drawn today and your tests are completely normal, you will receive your results only by: Marland Kitchen MyChart Message (if you have MyChart) OR . A paper copy in the mail If you have any lab test that is abnormal or we need to change your treatment, we will call you to review the results.   Testing/Procedures: Your physician has requested that you have an echocardiogram. Echocardiography is a painless test that uses sound waves to create images of your heart. It provides your doctor with information about the size and shape of your heart and how well your heart's chambers and valves are working. This procedure takes approximately one hour. There are no restrictions for this procedure.  Your physician has requested that you have a lexiscan myoview. For further information please visit [email or web address]. Please follow instruction sheet, as given.  The test will take approximately 3 to 4 hours to complete; you may bring reading material.  If someone comes with you to your appointment, they will need to remain in the main lobby due to limited space in the testing area. **If you are pregnant or breastfeeding, please notify the nuclear lab prior to your appointment**  How to prepare for your Myocardial Perfusion Test: . Do not eat or drink 3 hours prior to your test, except you may have water. . Do not consume products containing caffeine (regular or decaffeinated) 12 hours prior to your test. (ex: coffee, chocolate, sodas, tea). . Do bring a list of your current medications with you.  If not listed below, you may take your medications as normal. . Do wear comfortable clothes (no dresses or overalls) and walking shoes, tennis shoes preferred (No heels or open toe shoes are allowed). . Do NOT wear  cologne, perfume, aftershave, or lotions (deodorant is allowed). . If these instructions are not followed, your test will have to be rescheduled.    Follow-Up: At [facility], you and your health needs are our priority.  As part of our continuing mission to provide you with exceptional heart care, we have created designated Provider Care Teams.  These Care Teams include your primary Cardiologist (physician) and Advanced Practice Providers (APPs -  Physician Assistants and Nurse Practitioners) who all work together to provide you with the care you need, when you need it.  We recommend signing up for the patient portal called "MyChart".  Sign up information is provided on this After Visit Summary.  MyChart is used to connect with patients for Virtual Visits (Telemedicine).  Patients are able to view lab/test results, encounter notes, upcoming appointments, etc.  Non-urgent messages can be sent to your provider as well.   To learn more about what you can do with MyChart, go to [email or web address].    Your next appointment:   3 month(s)  The format for your next appointment:   In Person  Provider:   Jyl Heinz, MD   Other Instructions  Nuclear Medicine Exam A nuclear medicine exam is a safe and painless imaging test. It helps your health care provider detect and diagnose diseases. It also provides information about the ways your organs work and how they are structured. For a nuclear medicine exam, you will be given a radioactive tracer. This substance is absorbed by your body's organs. A large scanning machine detects the tracer and  creates pictures of the areas that your health care provider wants to know more about. There are several kinds of nuclear medicine exams. They include the following:  CT scan.  MRI scan.  PET scan.  SPECT scan. Tell your health care provider about:  Any allergies you have.  All medicines you are taking, including vitamins, herbs, eye drops,  creams, and over-the-counter medicines.  Any problems you or family members have had with anesthetic medicines.  Any blood disorders you have.  Any surgeries you have had.  Any medical conditions you have.  Whether you are pregnant or may be pregnant.  Whether you are nursing. What are the risks? Generally, this is a safe procedure. However, problems may occur, such as an allergic reaction to the tracer, but this is rare. What happens before the procedure? Medicines Ask your health care provider about:  Changing or stopping your regular medicines. This is especially important if you are taking diabetes medicines or blood thinners.  Taking medicines such as aspirin and ibuprofen. These medicines can thin your blood. Do not take these medicines unless your health care provider tells you to take them.  Taking over-the-counter medicines, vitamins, herbs, and supplements. General instructions  Follow instructions from your health care provider about eating or drinking restrictions.  Do not wear jewelry.  Wear loose, comfortable clothing. You may be asked to wear a hospital gown for the procedure.  Bring previous imaging studies, such as X-rays, with you to the exam if they are available. What happens during the procedure?   An IV may be inserted into one of your veins.  You will be asked to lie on a table or sit in a chair.  You will be given the radioactive tracer. You may get: ? A pill or liquid to swallow. ? An injection. ? Medicine through your IV. ? A gas to inhale.  A large scanning machine will be used to create images of your body. After the pictures are taken, you may have to wait so your health care provider can make sure that enough images were taken. The procedure may vary among health care providers and hospitals. What happens after the procedure?  You may go home after the procedure and return to your usual activities, unless your health care provider tells  you otherwise.  Drink enough water to keep your urine pale yellow. This helps to remove the radioactive tracer from your body.  It is up to you to get the results of your procedure. Ask your health care provider, or the department that is doing the procedure, when your results will be ready.  Get help right away if you have problems breathing. Summary  A nuclear medicine exam is a safe and painless imaging test that provides information about how your organs are working. It is also used to detect and diagnose diseases of various body organs.  Follow your health care provider's instructions about eating and drinking restrictions. Ask whether you should change or stop any medicines.  During the procedure, you will be given a radioactive tracer. A large scanning machine will create images of your body.  You may go home after the procedure and return to your regular activities. Follow your health care provider's instructions.  Get help right away if you have problems breathing. This information is not intended to replace advice given to you by your health care provider. Make sure you discuss any questions you have with your health care provider. Document Revised: 01/10/2018 Document Reviewed:  01/10/2018 Elsevier Patient Education  [place].  Echocardiogram An echocardiogram is a procedure that uses painless sound waves (ultrasound) to produce an image of the heart. Images from an echocardiogram can provide important information about:  Signs of coronary artery disease (CAD).  Aneurysm detection. An aneurysm is a weak or damaged part of an artery wall that bulges out from the normal force of blood pumping through the body.  Heart size and shape. Changes in the size or shape of the heart can be associated with certain conditions, including heart failure, aneurysm, and CAD.  Heart muscle function.  Heart valve function.  Signs of a past heart attack.  Fluid buildup around the  heart.  Thickening of the heart muscle.  A tumor or infectious growth around the heart valves. Tell a health care provider about:  Any allergies you have.  All medicines you are taking, including vitamins, herbs, eye drops, creams, and over-the-counter medicines.  Any blood disorders you have.  Any surgeries you have had.  Any medical conditions you have.  Whether you are pregnant or may be pregnant. What are the risks? Generally, this is a safe procedure. However, problems may occur, including:  Allergic reaction to dye (contrast) that may be used during the procedure. What happens before the procedure? No specific preparation is needed. You may eat and drink normally. What happens during the procedure?   An IV tube may be inserted into one of your veins.  You may receive contrast through this tube. A contrast is an injection that improves the quality of the pictures from your heart.  A gel will be applied to your chest.  A wand-like tool (transducer) will be moved over your chest. The gel will help to transmit the sound waves from the transducer.  The sound waves will harmlessly bounce off of your heart to allow the heart images to be captured in real-time motion. The images will be recorded on a computer. The procedure may vary among health care providers and hospitals. What happens after the procedure?  You may return to your normal, everyday life, including diet, activities, and medicines, unless your health care provider tells you not to do that. Summary  An echocardiogram is a procedure that uses painless sound waves (ultrasound) to produce an image of the heart.  Images from an echocardiogram can provide important information about the size and shape of your heart, heart muscle function, heart valve function, and fluid buildup around your heart.  You do not need to do anything to prepare before this procedure. You may eat and drink normally.  After the  echocardiogram is completed, you may return to your normal, everyday life, unless your health care provider tells you not to do that. This information is not intended to replace advice given to you by your health care provider. Make sure you discuss any questions you have with your health care provider. Document Revised: 06/14/2018 Document Reviewed: 03/26/2016 Elsevier Patient Education  [place].

## 2019-08-30 NOTE — Progress Notes (Signed)
Cardiology Office Note:    Date:  08/30/2019   ID:  Laurie Cervantes, DOB Dec 16, 1970, MRN [id number]  PCP:  Debbrah Alar, NP  Cardiologist:  Jenean Lindau, MD   Referring MD: Debbrah Alar, NP    ASSESSMENT:    1. Essential hypertension   2. Tobacco abuse   3. Cardiac murmur   4. Chest discomfort    PLAN:    In order of problems listed above:  1. Primary prevention stressed with the patient.  Importance of compliance with diet medication stressed and she vocalized understanding.  Weight reduction was stressed and she promises to do better.  She quit smoking 3 months ago and I congratulated her for this. 2. Essential hypertension: She showed me her blood pressure readings and they are essentially fine she needs to be a little better.  She drinks a lot of sodas and asked to cut it down and virtually eliminated if possible she promises to do so. 3. I will keep her blood pressure the same.  Her electrolytes are followed by her primary care doctor and she will get blood work today at her office per her primary care doctor's instructions 4. Cardiac murmur: Echocardiogram will be done to assess this 5. Chest discomfort: She has risk factors for coronary artery disease and so we will do a Lexiscan sestamibi. 6. After the tests are done and if they are negative I have asked her to start an exercise program.  Weight reduction was stressed.  She will be seen in follow-up appointment in 3 months or earlier if she has any concerns.  She knows to go to nearest emergency room for any concerning symptoms. 7. Cigarette smoker: Quit 3 months ago and promises never to return to smoking back again.  Risks explained.   Medication Adjustments/Labs and Tests Ordered: Current medicines are reviewed at length with the patient today.  Concerns regarding medicines are outlined above.  No orders of the defined types were placed in this encounter.  No orders of the defined types were placed in  this encounter.    History of Present Illness:    Laurie Cervantes is a 49 y.o. female who is being seen today for the evaluation of chest discomfort and essential hypertension at the request of Debbrah Alar, NP.  Patient is a pleasant 49 year old female.  She has past medical history of essential hypertension.  She is receiving treatment for this.  She leads a sedentary lifestyle.  She occasionally has chest discomfort.  Somewhat of a pressure-like sensation.  No radiation to any part of the body.  No orthopnea or PND.  Not really associated with stress.  She does not exercise and is sexually not active and therefore I could not elicit the symptomatology.  At the time of my evaluation, the patient is alert awake oriented and in no distress.  Past Medical History:  Diagnosis Date  . Bacterial vaginosis   . 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 Hypertension     Past Surgical History:  Procedure Laterality Date  . HAND SURGERY Left 2016   approximate date  . TUBAL LIGATION     PPTL    Current Medications: Current Meds  Medication Sig  . acetaminophen (TYLENOL) 500 MG tablet Take 500 mg by mouth as needed for headache.  . fluticasone (FLONASE) 50 MCG/ACT nasal spray Place 2 sprays into both nostrils daily.  Marland Kitchen levocetirizine (XYZAL) 5 MG tablet Take 1 tablet (5 mg  total) by mouth every evening.  . medroxyPROGESTERone (DEPO-PROVERA) 150 MG/ML injection Inject 1 mL (150 mg total) into the muscle every 3 (three) months.  . metoprolol succinate (TOPROL-XL) 25 MG 24 hr tablet Take 1 tablet (25 mg total) by mouth daily.  . potassium chloride SA (KLOR-CON) 20 MEQ tablet Take 1 tablet (20 mEq total) by mouth daily.  Marland Kitchen triamterene-hydrochlorothiazide (DYAZIDE) 37.5-25 MG capsule Take 1 each (1 capsule total) by mouth daily.     Allergies:   Patient has no known allergies.   Social History   Socioeconomic History  . Marital status: Single    Spouse name: Not on file    . Number of children: Not on file  . Years of education: Not on file  . Highest education level: Not on file  Occupational History  . Not on file  Tobacco Use  . Smoking status: Former Smoker    Types: Cigarettes  . Smokeless tobacco: Never Used  Vaping Use  . Vaping Use: Never used  Substance and Sexual Activity  . Alcohol use: No  . Drug use: No  . Sexual activity: Not Currently  Other Topics Concern  . Not on file  Social History Narrative   Work [employer]   2 grown sons   DIvorced   Enjoys shopping   No pets.    Social Determinants of Health   Financial Resource Strain:   . Difficulty of Paying Living Expenses:   Food Insecurity:   . Worried About Charity fundraiser in the Last Year:   . Arboriculturist in the Last Year:   Transportation Needs:   . Film/video editor (Medical):   Marland Kitchen Lack of Transportation (Non-Medical):   Physical Activity:   . Days of Exercise per Week:   . Minutes of Exercise per Session:   Stress:   . Feeling of Stress :   Social Connections:   . Frequency of Communication with Friends and Family:   . Frequency of Social Gatherings with Friends and Family:   . Attends Religious Services:   . Active Member of Clubs or Organizations:   . Attends Archivist Meetings:   Marland Kitchen Marital Status:      Family History: The patient's family history includes Breast cancer in her maternal aunt; Diabetes Mellitus II in her mother; Hypertension in her brother and mother.  ROS:   Please see the history of present illness.    All other systems reviewed and are negative.  EKGs/Labs/Other Studies Reviewed:    The following studies were reviewed today: I reviewed EKG reveals sinus rhythm and nonspecific ST-T changes   Recent Labs: 06/04/2019: TSH 1.17 08/04/2019: ALT 12; Hemoglobin 13.1; Platelets 373 08/22/2019: BUN 12; Creatinine, Ser 0.82; Potassium 3.4; Sodium 139  Recent Lipid Panel    Component Value Date/Time    CHOL 139 06/04/2019 0918   TRIG 46.0 06/04/2019 0918   HDL 32.60 (L) 06/04/2019 0918   CHOLHDL 4 06/04/2019 0918   VLDL 9.2 06/04/2019 0918   LDLCALC 97 06/04/2019 0918    Physical Exam:    VS:  BP (!) 150/80   Pulse 80   Ht 5\' 6"  (1.676 m)   Wt 211 lb (95.7 kg)   SpO2 99%   BMI 34.06 kg/m     Wt Readings from Last 3 Encounters:  08/30/19 211 lb (95.7 kg)  08/16/19 206 lb (93.4 kg)  08/06/19 206 lb (93.4 kg)     GEN: Patient is  in no acute distress HEENT: Normal NECK: No JVD; No carotid bruits LYMPHATICS: No lymphadenopathy CARDIAC: S1 S2 regular, 2/6 systolic murmur at the apex. RESPIRATORY:  Clear to auscultation without rales, wheezing or rhonchi  ABDOMEN: Soft, non-tender, non-distended MUSCULOSKELETAL:  No edema; No deformity  SKIN: Warm and dry NEUROLOGIC:  Alert and oriented x 3 PSYCHIATRIC:  Normal affect    Signed, Jenean Lindau, MD  08/30/2019 1:40 PM    [facility]

## 2019-08-30 NOTE — Telephone Encounter (Signed)
Caller: Nayla Call back phone number: [phone]  Patient states her medical records need to be fax to [facility]. (FMLA)

## 2019-08-30 NOTE — Telephone Encounter (Signed)
Paperwork re-faxed to [employer]. Conformation received on 08-26-19 and 08-30-19

## 2019-08-31 LAB — BASIC METABOLIC PANEL
BUN/Creatinine Ratio: 11 (ref 9–23)
BUN: 11 mg/dL (ref 6–24)
CO2: 24 mmol/L (ref 20–29)
Calcium: 9.8 mg/dL (ref 8.7–10.2)
Chloride: 105 mmol/L (ref 96–106)
Creatinine, Ser: 0.98 mg/dL (ref 0.57–1.00)
GFR calc Af Amer: 80 mL/min/{1.73_m2} (ref >59)
GFR calc non Af Amer: 69 mL/min/{1.73_m2} (ref >59)
Glucose: 125 mg/dL — ABNORMAL HIGH (ref 65–99)
Potassium: 4 mmol/L (ref 3.5–5.2)
Sodium: 144 mmol/L (ref 134–144)

## 2019-09-02 ENCOUNTER — Encounter: Payer: Self-pay | Admitting: Family

## 2019-09-02 DIAGNOSIS — R739 Hyperglycemia, unspecified: Secondary | ICD-10-CM | POA: Insufficient documentation

## 2019-09-02 NOTE — Telephone Encounter (Signed)
Records faxed again to [facility] on Friday

## 2019-09-03 ENCOUNTER — Other Ambulatory Visit: Payer: Self-pay

## 2019-09-04 ENCOUNTER — Ambulatory Visit (INDEPENDENT_AMBULATORY_CARE_PROVIDER_SITE_OTHER): Payer: 59 | Admitting: *Deleted

## 2019-09-04 DIAGNOSIS — Z3042 Encounter for surveillance of injectable contraceptive: Secondary | ICD-10-CM

## 2019-09-04 MED ORDER — MEDROXYPROGESTERONE ACETATE 150 MG/ML IM SUSP
150.0000 mg | Freq: Once | INTRAMUSCULAR | Status: AC
  Administered 2019-09-04: 150 mg via INTRAMUSCULAR

## 2019-09-04 NOTE — Addendum Note (Signed)
Addended by: Alen Blew on: 09/04/2019 07:42 PM   Modules accepted: Level of Service

## 2019-09-05 ENCOUNTER — Telehealth (HOSPITAL_COMMUNITY): Payer: Self-pay

## 2019-09-05 NOTE — Telephone Encounter (Signed)
Encounter complete. 

## 2019-09-06 ENCOUNTER — Telehealth (HOSPITAL_COMMUNITY): Payer: Self-pay

## 2019-09-06 NOTE — Telephone Encounter (Signed)
Encounter complete. 

## 2019-09-10 ENCOUNTER — Ambulatory Visit (HOSPITAL_COMMUNITY)
Admission: RE | Admit: 2019-09-10 | Discharge: 2019-09-10 | Disposition: A | Discharge status: Home / Self Care | Payer: 59 | Source: Ambulatory Visit | Attending: Internal Medicine | Admitting: Internal Medicine

## 2019-09-10 ENCOUNTER — Other Ambulatory Visit: Payer: Self-pay

## 2019-09-10 ENCOUNTER — Encounter (HOSPITAL_COMMUNITY): Payer: Self-pay

## 2019-09-10 ENCOUNTER — Telehealth (HOSPITAL_COMMUNITY): Payer: Self-pay

## 2019-09-10 ENCOUNTER — Encounter: Payer: Self-pay | Admitting: *Deleted

## 2019-09-10 DIAGNOSIS — R0789 Other chest pain: Secondary | ICD-10-CM

## 2019-09-10 DIAGNOSIS — R072 Precordial pain: Secondary | ICD-10-CM

## 2019-09-10 NOTE — Telephone Encounter (Signed)
Encounter complete. 

## 2019-09-11 ENCOUNTER — Ambulatory Visit (HOSPITAL_COMMUNITY)
Admission: RE | Admit: 2019-09-11 | Discharge: 2019-09-11 | Disposition: A | Discharge status: Home / Self Care | Payer: 59 | Source: Ambulatory Visit | Attending: Cardiology | Admitting: Cardiology

## 2019-09-11 DIAGNOSIS — R072 Precordial pain: Secondary | ICD-10-CM

## 2019-09-11 DIAGNOSIS — R0789 Other chest pain: Secondary | ICD-10-CM | POA: Diagnosis present

## 2019-09-11 LAB — MYOCARDIAL PERFUSION IMAGING
LV dias vol: 91 mL (ref 46–106)
LV sys vol: 28 mL
Peak HR: 117 {beats}/min
Rest HR: 96 {beats}/min
SDS: 0
SRS: 0
SSS: 0
TID: 1.28

## 2019-09-11 MED ORDER — TECHNETIUM TC 99M TETROFOSMIN IV KIT
31.5000 | PACK | Freq: Once | INTRAVENOUS | Status: AC | PRN
  Administered 2019-09-11: 31.5 via INTRAVENOUS
  Filled 2019-09-11: qty 32

## 2019-09-11 MED ORDER — TECHNETIUM TC 99M TETROFOSMIN IV KIT
10.9000 | PACK | Freq: Once | INTRAVENOUS | Status: AC | PRN
  Administered 2019-09-11: 10.9 via INTRAVENOUS
  Filled 2019-09-11: qty 11

## 2019-09-11 MED ORDER — REGADENOSON 0.4 MG/5ML IV SOLN
0.4000 mg | Freq: Once | INTRAVENOUS | Status: AC
Start: 2019-09-11 — End: 2019-09-11
  Administered 2019-09-11: 0.4 mg via INTRAVENOUS

## 2019-09-12 ENCOUNTER — Ambulatory Visit: Payer: 59 | Attending: Family | Admitting: Physical Therapy

## 2019-09-12 ENCOUNTER — Encounter: Payer: Self-pay | Admitting: Physical Therapy

## 2019-09-12 ENCOUNTER — Other Ambulatory Visit: Payer: Self-pay

## 2019-09-12 VITALS — BP 138/80 | HR 74

## 2019-09-12 DIAGNOSIS — M542 Cervicalgia: Secondary | ICD-10-CM | POA: Insufficient documentation

## 2019-09-12 DIAGNOSIS — R42 Dizziness and giddiness: Secondary | ICD-10-CM

## 2019-09-12 NOTE — Therapy (Addendum)
[facility] [address]  [place], [place], [address] Phone: [phone]   Fax:  [phone]  Physical Therapy Treatment  Patient Details  Name: Laurie Cervantes MRN: [id number] Date of Birth: 1970-04-01 Referring Provider (PT): Debbrah Alar, NP   Encounter Date: 09/12/2019   PT End of Session - 09/12/19 1617    Visit Number 2    Number of Visits 7    Date for PT Re-Evaluation 10/10/19    Authorization Type UHC    PT Start Time 1528    PT Stop Time 1611    PT Time Calculation (min) 43 min    Activity Tolerance Patient tolerated treatment well    Behavior During Therapy [facility] for tasks assessed/performed           Past Medical History:  Diagnosis Date  . Bacterial vaginosis   . 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 Hyperglycemia   . Hypertension     Past Surgical History:  Procedure Laterality Date  . HAND SURGERY Left 2016   approximate date  . TUBAL LIGATION     PPTL    Vitals:   09/12/19 1534  BP: 138/80  Pulse: 74  SpO2: 99%     Subjective Assessment - 09/12/19 1529    Subjective Feeling better. While performing Nestor Lewandowsky on Saturday she did have an episode of dizziness when coming up form the L which resolved within a couple seconds. Has taken Pepto tablets for the "woozy feeling and nausea." 010 dizziness currently. Had some cardiac tests done which were "unremarkable." Neck also feeling better.    Pertinent History HTN, L hand surgery    Diagnostic tests 07/19/19 CT head: Study within normal limits.    Patient Stated Goals get rid of dizziness    Currently in Pain? No/denies              [facility] PT Assessment - 09/12/19 0001      ROM / Strength   AROM / PROM / Strength AROM      AROM   AROM Assessment Site Cervical    Cervical Flexion 38    Cervical Extension 68    Cervical - Right Side Bend 44    Cervical - Left Side Bend 37    Cervical - Right Rotation  70    Cervical - Left Rotation 68      Functional Gait  Assessment   Gait assessed  Yes    Gait Level Surface Walks 20 ft in less than 5.5 sec, no assistive devices, good speed, no evidence for imbalance, normal gait pattern, deviates no more than 6 in outside of the 12 in walkway width.    Change in Gait Speed Able to smoothly change walking speed without loss of balance or gait deviation. Deviate no more than 6 in outside of the 12 in walkway width.    Gait with Horizontal Head Turns Performs head turns smoothly with no change in gait. Deviates no more than 6 in outside 12 in walkway width    Gait with Vertical Head Turns Performs head turns with no change in gait. Deviates no more than 6 in outside 12 in walkway width.    Gait and Pivot Turn Pivot turns safely within 3 sec and stops quickly with no loss of balance.    Step Over Obstacle Is able to step over 2 stacked shoe boxes taped together (9 in total height)  without changing gait speed. No evidence of imbalance.    Gait with Narrow Base of Support Is able to ambulate for 10 steps heel to toe with no staggering.    Gait with Eyes Closed Walks 20 ft, no assistive devices, good speed, no evidence of imbalance, normal gait pattern, deviates no more than 6 in outside 12 in walkway width. Ambulates 20 ft in less than 7 sec.    Ambulating Backwards Walks 20 ft, no assistive devices, good speed, no evidence for imbalance, normal gait    Steps Alternating feet, no rail.    Total Score 30                         [facility] Adult PT Treatment/Exercise - 09/12/19 0001      Neuro Re-ed    Neuro Re-ed Details  STS on foam with gaze stabilization x5, with EC on foam 5x    good stability          Vestibular Treatment/Exercise - 09/12/19 0001      Vestibular Treatment/Exercise   Vestibular Treatment Provided Habituation;Gaze    Habituation Exercises Nestor Lewandowsky    Gaze Exercises X1 Viewing Horizontal;X1 Viewing Vertical      Nestor Lewandowsky   Number of Reps  3   each side   Symptom Description  2nd rep with EC, 3rd rep EC + feet on dynadisc      X1 Viewing Horizontal   Foot Position standing    Reps --   2x10   Comments 2x10 standing on firm surface, 2x10 standing on foam    c/o unsteadiness on foam     X1 Viewing Vertical   Foot Position standing    Reps 10    Comments standing on foam    slight unsteadiness                PT Education - 09/12/19 1617    Education Details update to HEP; discussion on patient's remaining challenges and progress towards goals.    Person(s) Educated Patient    Methods Explanation;Demonstration;Tactile cues;Verbal cues;Handout    Comprehension Verbalized understanding;Returned demonstration            PT Short Term Goals - 09/12/19 1601      PT SHORT TERM GOAL #1   Title Patient to be independent with initial HEP.    Time 2    Period Weeks    Status Achieved    Target Date 09/12/19             PT Long Term Goals - 09/12/19 1602      PT LONG TERM GOAL #1   Title Patient to be independent with advanced HEP.    Time 6    Period Weeks    Status Partially Met   administered today     PT LONG TERM GOAL #2   Title Patient to report 0/10 dizziness with Nestor Lewandowsky with EO and EC.    Time 6    Period Weeks    Status On-going   reporting 4-5/10 with EC     PT LONG TERM GOAL #3   Title Patient to report 100% resolution of dizziness with work activities.    Time 6    Period Weeks    Status On-going   reports 65-70% improvement     PT LONG TERM GOAL #4   Title Patient to score >22/30 on FGA in order to decrease risk of  falls.    Time 6    Period Weeks    Status Achieved   09/12/19 30/30     PT LONG TERM GOAL #5   Title Patient to report no remaining neck pain with quick head turns.    Time 6    Period Weeks    Status Achieved                 Plan - 09/12/19 1618    Clinical Impression Statement Patient arrived to session with report of  improvement in dizziness. Still slightly symptomatic with Nestor Lewandowsky on the L. Tolerated Nestor Lewandowsky with decreased visual and pronociceptive input with dizziness increasing as input was removed, worse on L vs. R. Patient did have some difficulty maintaining gaze fixation with horizontal head turns on firm surface and demonstrated moderate unsteadiness on foam surface. Better tolerance for vertical head turns. Patient has met FGA and neck pain goals. Cervical AROM today was [facility] and pain-free. Patient was educated on progressive habitation and VOR HEP and reported understanding. Patient noting that she would like to transition to home program at this time d/t improvement in dizziness. Patient now on 30 day hold.    Personal Factors and Comorbidities Age;Comorbidity 1;Time since onset of injury/illness/exacerbation;Profession;Past/Current Experience    Comorbidities HTN, L hand surgery    Examination-Activity Limitations Bed Mobility;Sleep;Bend;Stairs;Lift;Reach Overhead;Transfers    Examination-Participation Restrictions Church;Cleaning;Shop;Community Activity;Driving;Yard Work;Meal Prep;Laundry    Stability/Clinical Decision Making Stable/Uncomplicated    Rehab Potential Good    PT Frequency Other (comment)   1x/week or biweekly as needed   PT Duration 6 weeks    PT Treatment/Interventions ADLs/Self Care Home Management;Canalith Repostioning;Moist Heat;Cryotherapy;Lobbyist;Therapeutic exercise;Therapeutic activities;Functional mobility training;Stair training;Gait training;Ultrasound;Neuromuscular re-education;Patient/family education;Manual techniques;Vestibular;Taping;Energy conservation;Dry needling;Passive range of motion    PT Next Visit Plan 30 dya hold at this time    Consulted and Agree with Plan of Care Patient           Patient will benefit from skilled therapeutic intervention in order to improve the following deficits and impairments:  Decreased  activity tolerance, Pain, Decreased balance, Difficulty walking, Improper body mechanics, Decreased range of motion, Postural dysfunction, Impaired flexibility, Dizziness  Visit Diagnosis: Dizziness and giddiness  Cervicalgia     Problem List Patient Active Problem List   Diagnosis Date Noted  . Hyperglycemia   . Cardiac murmur 08/30/2019  . Chest discomfort 08/30/2019  . Essential hypertension 04/29/2019  . Tobacco abuse 04/29/2019  . Herpes simplex type 2 infection 04/29/2019     Janene Harvey, PT, DPT 09/12/19 6:20 PM   [facility] [address]  [place], [place], [address] Phone: [phone]   Fax:  [phone]  Name: Laurie Cervantes MRN: [id number] Date of Birth: December 11, 1970   PHYSICAL THERAPY DISCHARGE SUMMARY  Visits from Start of Care: 2  Current functional level related to goals / functional outcomes: See above clinical impression; patient did not return since 30 day hold   Remaining deficits: Dizziness with Nestor Lewandowsky   Education / Equipment: HEP  Plan: Patient agrees to discharge.  Patient goals were partially met. Patient is being discharged due to being pleased with the current functional level.  ?????     Janene Harvey, PT, DPT 11/04/19 1:23 PM

## 2019-09-18 ENCOUNTER — Other Ambulatory Visit: Payer: Self-pay

## 2019-09-18 ENCOUNTER — Ambulatory Visit (HOSPITAL_COMMUNITY): Payer: 59 | Attending: Cardiology

## 2019-09-18 DIAGNOSIS — R011 Cardiac murmur, unspecified: Secondary | ICD-10-CM | POA: Diagnosis not present

## 2019-09-19 ENCOUNTER — Telehealth: Payer: Self-pay

## 2019-09-19 NOTE — Telephone Encounter (Signed)
Patient had four teeth pulled at the dentist and is on an antibiotic. She is requesting a cream for vaginal yeast infection.

## 2019-09-20 MED ORDER — TERCONAZOLE 0.8 % VA CREA
1.0000 | TOPICAL_CREAM | Freq: Every day | VAGINAL | 0 refills | Status: DC
Start: 2019-09-20 — End: 2019-10-11

## 2019-09-20 NOTE — Telephone Encounter (Signed)
I spoke with Dr. Dellis Filbert and she was fine with treating the vaginal yeast infection over the phone. Offered Diflucan or Terazol Cm. Patient said she is already taking a lot of oral meds and prefers a cream. Rx sent for Terazol 3 vag cm.

## 2019-10-11 ENCOUNTER — Ambulatory Visit (INDEPENDENT_AMBULATORY_CARE_PROVIDER_SITE_OTHER): Payer: 59 | Admitting: Neurology

## 2019-10-11 ENCOUNTER — Other Ambulatory Visit: Payer: Self-pay

## 2019-10-11 ENCOUNTER — Encounter: Payer: Self-pay | Admitting: Neurology

## 2019-10-11 VITALS — BP 109/67 | HR 79 | Ht 66.0 in | Wt 205.0 lb

## 2019-10-11 DIAGNOSIS — G43709 Chronic migraine without aura, not intractable, without status migrainosus: Secondary | ICD-10-CM

## 2019-10-11 DIAGNOSIS — IMO0002 Reserved for concepts with insufficient information to code with codable children: Secondary | ICD-10-CM

## 2019-10-11 MED ORDER — RIZATRIPTAN BENZOATE 5 MG PO TBDP: 5.0000 mg | ORAL_TABLET | ORAL | 5 refills | Status: DC | PRN

## 2019-10-11 NOTE — Progress Notes (Signed)
Laurie Cervantes is a 49 year old female, seen in request by her primary care physician Dr. Criss Rosales, Myra Rude, for evaluation of headache, dizziness, initial evaluation was on October 11, 2019  I reviewed and summarized the referring note. Anxiety, HTN  She had a history of intermittent headaches since teens, usually happen around her menstruation, lasting for couple days, responding well to over-the-counter NSAIDs, in April 2021, following her second dose of Pfizer vaccine, she had a persistent headache for few weeks, bilateral occipital, sometimes bilateral temporal, vertex region, during intense headache she also complains of blurry vision, dizziness, difficulty focusing, Tylenol did not help, Motrin was helpful within couple hours,  She presented to the emergency room Jul 19, 2019 for headache, and elevated blood pressure, I personally reviewed CT head without contrast that was normal  Laboratory evaluation showed normal TSH, CBC, CMP  She also went through vestibular rehab, which has helped her dizziness, now she is back to her baseline, only occasionally headache, meds can be triggered by her menstruation, missing her meals,  REVIEW OF SYSTEMS: Full 14 system review of systems performed and notable only for as above All other review of systems were negative.  ALLERGIES: No Known Allergies  HOME MEDICATIONS: Current Outpatient Medications  Medication Sig Dispense Refill  . cyclobenzaprine (FLEXERIL) 5 MG tablet Take 5-10 mg by mouth at bedtime as needed.    Marland Kitchen escitalopram (LEXAPRO) 5 MG tablet Take 5 mg by mouth daily.    . fexofenadine (ALLEGRA) 180 MG tablet Take 180 mg by mouth daily.    . fluticasone (FLONASE) 50 MCG/ACT nasal spray Place 2 sprays into both nostrils daily. 16 g 6  . medroxyPROGESTERone (DEPO-PROVERA) 150 MG/ML injection Inject 1 mL (150 mg total) into the muscle every 3 (three) months. 1 mL 4  . potassium chloride SA (KLOR-CON) 20 MEQ tablet Take 1 tablet  (20 mEq total) by mouth daily. 30 tablet 3  . acetaminophen (TYLENOL) 500 MG tablet Take 500 mg by mouth as needed for headache.    . triamterene-hydrochlorothiazide (DYAZIDE) 37.5-25 MG capsule Take 1 each (1 capsule total) by mouth daily. 90 capsule 1   No current facility-administered medications for this visit.    PAST MEDICAL HISTORY: Past Medical History:  Diagnosis Date  . Bacterial vaginosis   . 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 Hyperglycemia   . Hypertension     PAST SURGICAL HISTORY: Past Surgical History:  Procedure Laterality Date  . HAND SURGERY Left 2016   approximate date  . TUBAL LIGATION     PPTL    FAMILY HISTORY: Family History  Problem Relation Age of Onset  . Hypertension Mother   . Diabetes Mellitus II Mother   . Breast cancer Maternal Aunt   . Hypertension Brother     SOCIAL HISTORY: Social History   Socioeconomic History  . Marital status: Single    Spouse name: Not on file  . Number of children: Not on file  . Years of education: Not on file  . Highest education level: Not on file  Occupational History  . Not on file  Tobacco Use  . Smoking status: Former Smoker    Types: Cigarettes  . Smokeless tobacco: Never Used  Vaping Use  . Vaping Use: Never used  Substance and Sexual Activity  . Alcohol use: No  . Drug use: No  . Sexual activity: Not Currently  Other Topics Concern  . Not on file  Social  History Narrative   Work [employer]   2 grown sons   DIvorced   Enjoys shopping   No pets.    Social Determinants of Health   Financial Resource Strain:   . Difficulty of Paying Living Expenses:   Food Insecurity:   . Worried About Charity fundraiser in the Last Year:   . Arboriculturist in the Last Year:   Transportation Needs:   . Film/video editor (Medical):   Marland Kitchen Lack of Transportation (Non-Medical):   Physical Activity:   . Days of Exercise per Week:   . Minutes of Exercise per  Session:   Stress:   . Feeling of Stress :   Social Connections:   . Frequency of Communication with Friends and Family:   . Frequency of Social Gatherings with Friends and Family:   . Attends Religious Services:   . Active Member of Clubs or Organizations:   . Attends Archivist Meetings:   Marland Kitchen Marital Status:   Intimate Partner Violence:   . Fear of Current or Ex-Partner:   . Emotionally Abused:   Marland Kitchen Physically Abused:   . Sexually Abused:      PHYSICAL EXAM   Vitals:   10/11/19 0816  BP: 109/67  Pulse: 79  Weight: 205 lb (93 kg)  Height: 5\' 6"  (1.676 m)   Not recorded     Body mass index is 33.09 kg/m.  PHYSICAL EXAMNIATION:  Gen: NAD, conversant, well nourised, well groomed                     Cardiovascular: Regular rate rhythm, no peripheral edema, warm, nontender. Eyes: Conjunctivae clear without exudates or hemorrhage Neck: Supple, no carotid bruits. Pulmonary: Clear to auscultation bilaterally   NEUROLOGICAL EXAM:  MENTAL STATUS: Speech:    Speech is normal; fluent and spontaneous with normal comprehension.  Cognition:     Orientation to time, place and person     Normal recent and remote memory     Normal Attention span and concentration     Normal Language, naming, repeating,spontaneous speech     Fund of knowledge   CRANIAL NERVES: CN II: Visual fields are full to confrontation. Pupils are round equal and briskly reactive to light. CN III, IV, VI: extraocular movement are normal. No ptosis. CN V: Facial sensation is intact to light touch CN VII: Face is symmetric with normal eye closure  CN VIII: Hearing is normal to causal conversation. CN IX, X: Phonation is normal. CN XI: Head turning and shoulder shrug are intact  MOTOR: There is no pronator drift of out-stretched arms. Muscle bulk and tone are normal. Muscle strength is normal.  REFLEXES: Reflexes are 2+ and symmetric at the biceps, triceps, knees, and ankles. Plantar  responses are flexor.  SENSORY: Intact to light touch, pinprick and vibratory sensation are intact in fingers and toes.  COORDINATION: There is no trunk or limb dysmetria noted.  GAIT/STANCE: Posture is normal. Gait is steady with normal steps, base, arm swing, and turning. Heel and toe walking are normal. Tandem gait is normal.  Romberg is absent.   DIAGNOSTIC DATA (LABS, IMAGING, TESTING) - I reviewed patient records, labs, notes, testing and imaging myself where available.   ASSESSMENT AND PLAN  JIA DOTTAVIO is a 49 y.o. female   Chronic migraine Persistent headache following second dose of Pfizer Covid vaccine, now has improved  Normal CT of the head normal neurological examination  Aleve  as needed,  Maxalt 5 mg as needed for prolonged severe headaches   Marcial Pacas, M.D. Ph.D.  [facility] [address], [place], [place] [address] Ph: [phone] Fax: [phone]  CC:  Debbrah Alar, [address] [place],  [place] [address]

## 2019-11-12 ENCOUNTER — Ambulatory Visit: Payer: 59 | Admitting: Neurology

## 2019-11-26 ENCOUNTER — Telehealth: Payer: Self-pay | Admitting: *Deleted

## 2019-11-26 MED ORDER — MEDROXYPROGESTERONE ACETATE 150 MG/ML IM SUSP: 150.0000 mg | INTRAMUSCULAR | 2 refills | Status: DC

## 2019-11-26 NOTE — Telephone Encounter (Signed)
Patient called requesting a new pharmacy placed on file so depo provera can be sent to pharmacy. Rx sent.

## 2019-11-28 ENCOUNTER — Other Ambulatory Visit: Payer: Self-pay

## 2019-11-28 ENCOUNTER — Ambulatory Visit (INDEPENDENT_AMBULATORY_CARE_PROVIDER_SITE_OTHER): Payer: 59 | Admitting: *Deleted

## 2019-11-28 DIAGNOSIS — Z3042 Encounter for surveillance of injectable contraceptive: Secondary | ICD-10-CM

## 2019-11-28 MED ORDER — MEDROXYPROGESTERONE ACETATE 150 MG/ML IM SUSP
150.0000 mg | Freq: Once | INTRAMUSCULAR | Status: AC
  Administered 2019-11-28: 150 mg via INTRAMUSCULAR

## 2019-11-29 ENCOUNTER — Ambulatory Visit: Payer: 59 | Admitting: Family

## 2019-12-06 ENCOUNTER — Ambulatory Visit: Payer: 59 | Admitting: Cardiology

## 2020-02-21 ENCOUNTER — Other Ambulatory Visit: Payer: Self-pay

## 2020-02-21 ENCOUNTER — Ambulatory Visit (INDEPENDENT_AMBULATORY_CARE_PROVIDER_SITE_OTHER): Payer: 59 | Admitting: Anesthesiology

## 2020-02-21 DIAGNOSIS — Z3042 Encounter for surveillance of injectable contraceptive: Secondary | ICD-10-CM

## 2020-02-21 MED ORDER — MEDROXYPROGESTERONE ACETATE 150 MG/ML IM SUSP
150.0000 mg | Freq: Once | INTRAMUSCULAR | Status: AC
  Administered 2020-02-21: 14:00:00 150 mg via INTRAMUSCULAR

## 2020-05-15 ENCOUNTER — Other Ambulatory Visit: Payer: Self-pay

## 2020-05-15 ENCOUNTER — Ambulatory Visit (INDEPENDENT_AMBULATORY_CARE_PROVIDER_SITE_OTHER): Payer: 59 | Admitting: Anesthesiology

## 2020-05-15 DIAGNOSIS — Z3042 Encounter for surveillance of injectable contraceptive: Secondary | ICD-10-CM | POA: Diagnosis not present

## 2020-05-15 MED ORDER — MEDROXYPROGESTERONE ACETATE 150 MG/ML IM SUSP
150.0000 mg | Freq: Once | INTRAMUSCULAR | Status: AC
  Administered 2020-05-15: 150 mg via INTRAMUSCULAR

## 2020-07-23 ENCOUNTER — Ambulatory Visit: Payer: 59 | Admitting: Obstetrics & Gynecology

## 2020-08-10 ENCOUNTER — Other Ambulatory Visit: Payer: Self-pay | Admitting: Obstetrics & Gynecology

## 2020-09-17 ENCOUNTER — Other Ambulatory Visit: Payer: Self-pay | Admitting: Obstetrics & Gynecology

## 2020-09-17 DIAGNOSIS — Z1231 Encounter for screening mammogram for malignant neoplasm of breast: Secondary | ICD-10-CM

## 2020-09-21 ENCOUNTER — Other Ambulatory Visit: Payer: Self-pay

## 2020-09-21 NOTE — Telephone Encounter (Signed)
Patient scheduled appt for 09/25/20 for D-P injection and needs refill.  SHe is overdue for her AEX. Last AEX 06/19/19 but she scheduled AEX 12/11/20 with Dr. Marguerita Merles.

## 2020-09-23 ENCOUNTER — Other Ambulatory Visit: Payer: Self-pay

## 2020-09-23 ENCOUNTER — Ambulatory Visit
Admission: RE | Admit: 2020-09-23 | Discharge: 2020-09-23 | Disposition: A | Discharge status: Home / Self Care | Payer: 59 | Source: Ambulatory Visit

## 2020-09-23 DIAGNOSIS — Z1231 Encounter for screening mammogram for malignant neoplasm of breast: Secondary | ICD-10-CM

## 2020-09-25 ENCOUNTER — Ambulatory Visit (INDEPENDENT_AMBULATORY_CARE_PROVIDER_SITE_OTHER): Payer: 59 | Admitting: *Deleted

## 2020-09-25 ENCOUNTER — Other Ambulatory Visit: Payer: Self-pay

## 2020-09-25 VITALS — BP 110/76 | HR 60 | Resp 12 | Ht 66.0 in | Wt 228.0 lb

## 2020-09-25 DIAGNOSIS — N926 Irregular menstruation, unspecified: Secondary | ICD-10-CM | POA: Diagnosis not present

## 2020-09-25 DIAGNOSIS — Z3042 Encounter for surveillance of injectable contraceptive: Secondary | ICD-10-CM

## 2020-09-25 LAB — PREGNANCY, URINE: Preg Test, Ur: NEGATIVE

## 2020-09-25 MED ORDER — MEDROXYPROGESTERONE ACETATE 150 MG/ML IM SUSP
150.0000 mg | Freq: Once | INTRAMUSCULAR | Status: AC
  Administered 2020-09-25: 150 mg via INTRAMUSCULAR

## 2020-09-25 NOTE — Progress Notes (Signed)
Patient is here for Depo Provera Injection Patient is within Depo Provera Calender Limits no, last given 05-15-20. UPT in office negative. Patient states she has not had unprotected intercourse in the last 2 weeks.  Next Depo Due between: 10/7 to 10/21 Last AEX: 06-19-19 ML AEX Scheduled: 12-11-20  Patient is aware when next depo is due  Pt tolerated Injection well in [place].   Routed to provider for review, encounter closed.  Patient aware to use a back-up method 7 days.

## 2020-09-26 MED ORDER — MEDROXYPROGESTERONE ACETATE 150 MG/ML IM SUSP: 150.0000 mg | INTRAMUSCULAR | 0 refills | Status: DC

## 2020-12-11 ENCOUNTER — Ambulatory Visit (INDEPENDENT_AMBULATORY_CARE_PROVIDER_SITE_OTHER): Payer: 59 | Admitting: Obstetrics & Gynecology

## 2020-12-11 ENCOUNTER — Other Ambulatory Visit: Payer: Self-pay

## 2020-12-11 ENCOUNTER — Encounter: Payer: Self-pay | Admitting: Obstetrics & Gynecology

## 2020-12-11 VITALS — BP 106/76 | HR 76 | Resp 16 | Ht 65.75 in | Wt 220.0 lb

## 2020-12-11 DIAGNOSIS — Z3042 Encounter for surveillance of injectable contraceptive: Secondary | ICD-10-CM

## 2020-12-11 DIAGNOSIS — Z01419 Encounter for gynecological examination (general) (routine) without abnormal findings: Secondary | ICD-10-CM | POA: Diagnosis not present

## 2020-12-11 DIAGNOSIS — Z6835 Body mass index (BMI) 35.0-35.9, adult: Secondary | ICD-10-CM

## 2020-12-11 MED ORDER — MEDROXYPROGESTERONE ACETATE 150 MG/ML IM SUSP: 150.0000 mg | INTRAMUSCULAR | 4 refills | Status: AC

## 2020-12-11 NOTE — Progress Notes (Signed)
Laurie Cervantes 1971-02-28 [id number]   History:    50 y.o. G3P2A1L2 Single   RP:  Established patient presenting for annual gyn exam    HPI: Well on Depo-Provera injections with very light menses.  No pelvic pain.  No breakthrough bleeding.  No pain with IC.  Uses condoms.  Urine and bowel movements normal.  Breasts normal.  Screening Mammo Neg 09/2020.  Body mass index 35.78.  Not regularly physically active.  Health labs with family physician.   Past medical history,surgical history, family history and social history were all reviewed and documented in the EPIC chart.  Gynecologic History No LMP recorded. Patient has had an injection.  Obstetric History OB History  Gravida Para Term Preterm AB Living  3 2 2   1 2   SAB IAB Ectopic Multiple Live Births  1       2    # Outcome Date GA Lbr Len/2nd Weight Sex Delivery Anes PTL Lv  3 SAB           2 Term     M Vag-Spont  N LIV  1 Term     M Vag-Spont  N LIV     ROS: A ROS was performed and pertinent positives and negatives are included in the history.  GENERAL: No fevers or chills. HEENT: No change in vision, no earache, sore throat or sinus congestion. NECK: No pain or stiffness. CARDIOVASCULAR: No chest pain or pressure. No palpitations. PULMONARY: No shortness of breath, cough or wheeze. GASTROINTESTINAL: No abdominal pain, nausea, vomiting or diarrhea, melena or bright red blood per rectum. GENITOURINARY: No urinary frequency, urgency, hesitancy or dysuria. MUSCULOSKELETAL: No joint or muscle pain, no back pain, no recent trauma. DERMATOLOGIC: No rash, no itching, no lesions. ENDOCRINE: No polyuria, polydipsia, no heat or cold intolerance. No recent change in weight. HEMATOLOGICAL: No anemia or easy bruising or bleeding. NEUROLOGIC: No headache, seizures, numbness, tingling or weakness. PSYCHIATRIC: No depression, no loss of interest in normal activity or change in sleep pattern.     Exam:   BP 106/76   Pulse 76   Resp 16    Ht 5' 5.75" (1.67 m)   Wt 220 lb (99.8 kg)   BMI 35.78 kg/m   Body mass index is 35.78 kg/m.  General appearance : Well developed well nourished female. No acute distress HEENT: Eyes: no retinal hemorrhage or exudates,  Neck supple, trachea midline, no carotid bruits, no thyroidmegaly Lungs: Clear to auscultation, no rhonchi or wheezes, or rib retractions  Heart: Regular rate and rhythm, no murmurs or gallops Breast:Examined in sitting and supine position were symmetrical in appearance, no palpable masses or tenderness,  no skin retraction, no nipple inversion, no nipple discharge, no skin discoloration, no axillary or supraclavicular lymphadenopathy Abdomen: no palpable masses or tenderness, no rebound or guarding Extremities: no edema or skin discoloration or tenderness  Pelvic: Vulva: Normal             Vagina: No gross lesions or discharge  Cervix: No gross lesions or discharge  Uterus  AV, normal size, shape and consistency, non-tender and mobile  Adnexa  Without masses or tenderness  Anus: Normal   Assessment/Plan:  50 y.o. female for annual exam   1. Well female exam with routine gynecological exam Normal gynecologic exam.  No indication to repeat a Pap test at this time, Pap test negative in 2021.  Breast exam normal.  Screening mammogram negative in July 2022.  Recommend doing a  screening colonoscopy.  Health labs with family physician.  2. Surveillance for Depo-Provera contraception Well on Depo-Provera.  No contraindication to continue.  Injection given today.  Depo-Provera prescription sent to pharmacy.  3. Class 2 severe obesity due to excess calories with serious comorbidity and body mass index (BMI) of 35.0 to 35.9 in adult [facility]) Recommend a lower calorie/carb diet.  Aerobic activities 5 times a week and light weightlifting every 2 days.  Other orders - amLODipine (NORVASC) 5 MG tablet; Take 5 mg by mouth daily. - nebivolol (BYSTOLIC) 5 MG tablet; Take 5 mg by  mouth daily as needed. - valsartan-hydrochlorothiazide (DIOVAN-HCT) 160-12.5 MG tablet; Take 1 tablet by mouth daily. - medroxyPROGESTERone (DEPO-PROVERA) 150 MG/ML injection; Inject 1 mL (150 mg total) into the muscle every 3 (three) months.   Princess Bruins MD, 4:27 PM 12/11/2020

## 2020-12-11 NOTE — Progress Notes (Signed)
Depo provera given today in [place] Exp 09/2022 [facility] [id number] Patient aware next depo due dec 23-jan 6

## 2020-12-15 DIAGNOSIS — Z3042 Encounter for surveillance of injectable contraceptive: Secondary | ICD-10-CM | POA: Diagnosis not present

## 2020-12-15 MED ORDER — MEDROXYPROGESTERONE ACETATE 150 MG/ML IM SUSP
150.0000 mg | Freq: Once | INTRAMUSCULAR | Status: AC
  Administered 2020-12-15: 150 mg via INTRAMUSCULAR

## 2020-12-15 NOTE — Addendum Note (Signed)
Addended by: Susy Manor on: 12/15/2020 07:40 AM   Modules accepted: Orders

## 2021-01-04 IMAGING — MG DIGITAL SCREENING BILAT W/ TOMO W/ CAD
8 series · 8 of 24 positions shown · non-contrast
Comparison: Previous exam(s).

CLINICAL DATA: Screening.

EXAM:
DIGITAL SCREENING BILATERAL MAMMOGRAM WITH TOMO AND CAD

[R CC synth-2D]
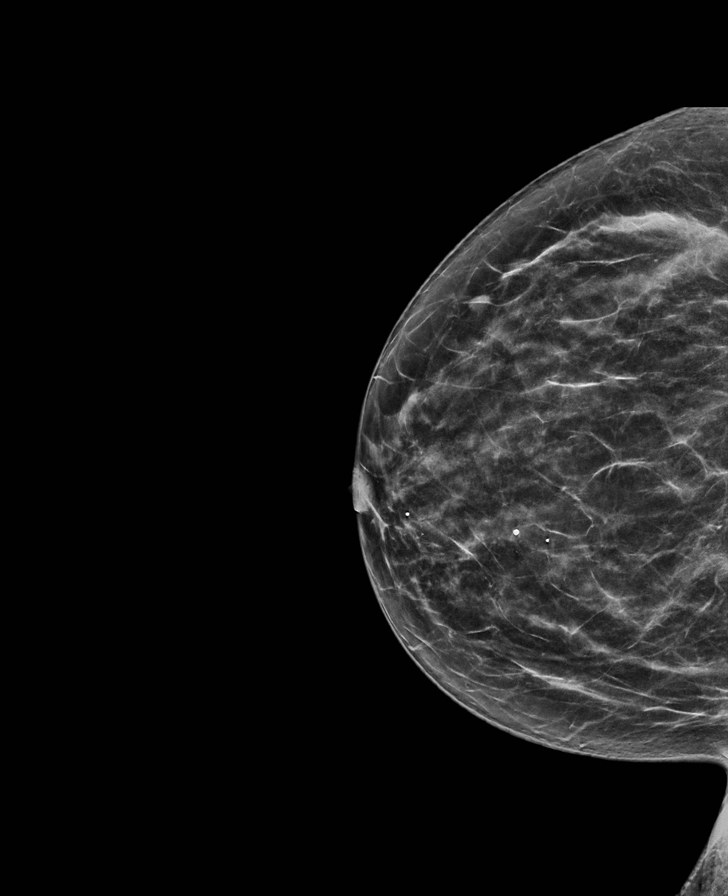

[L MLO synth-2D]
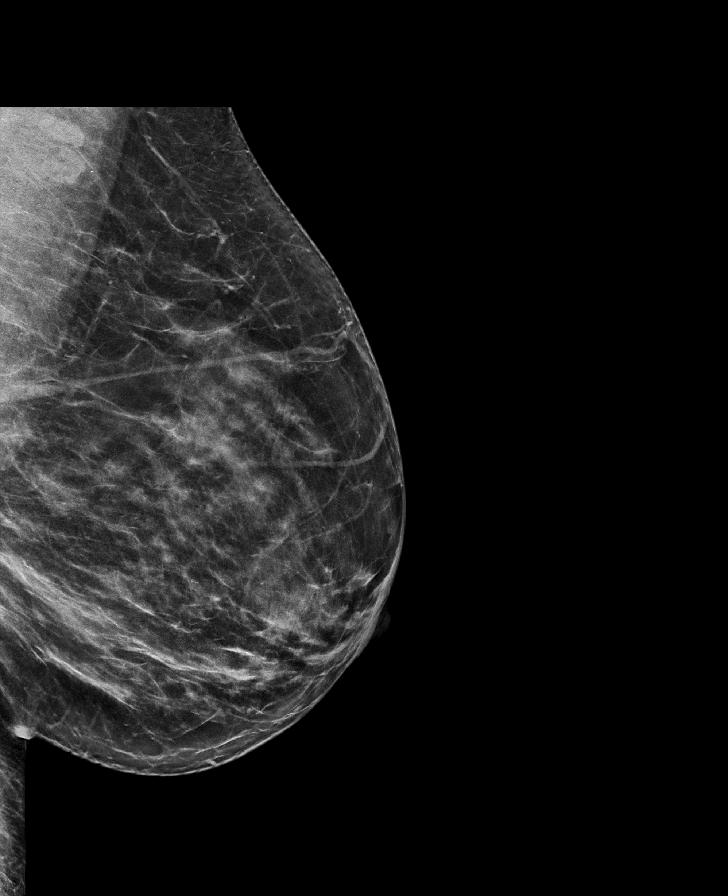

[R MLO synth-2D]
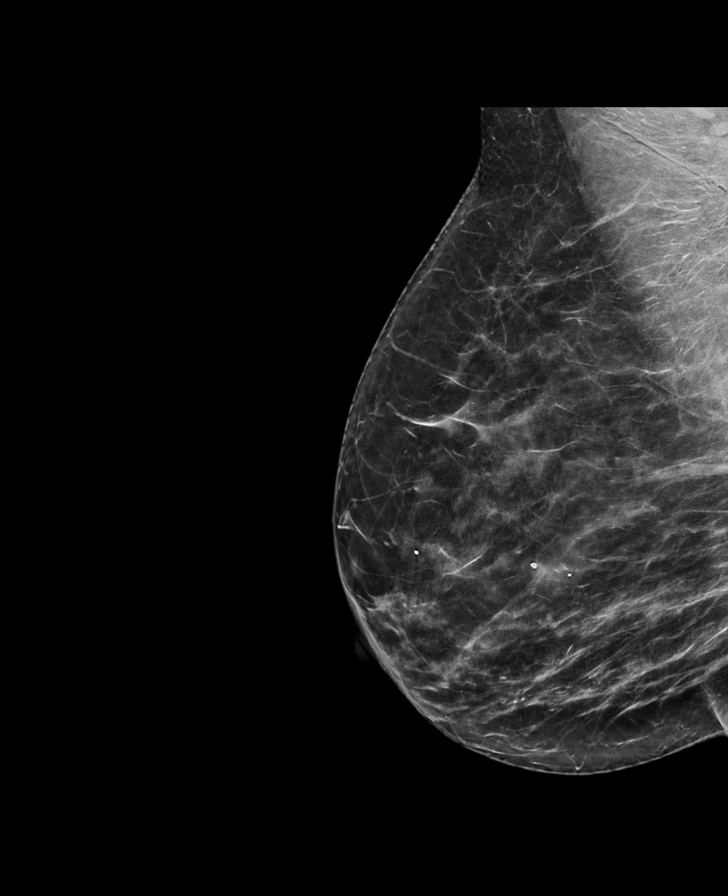

[L CC synth-2D]
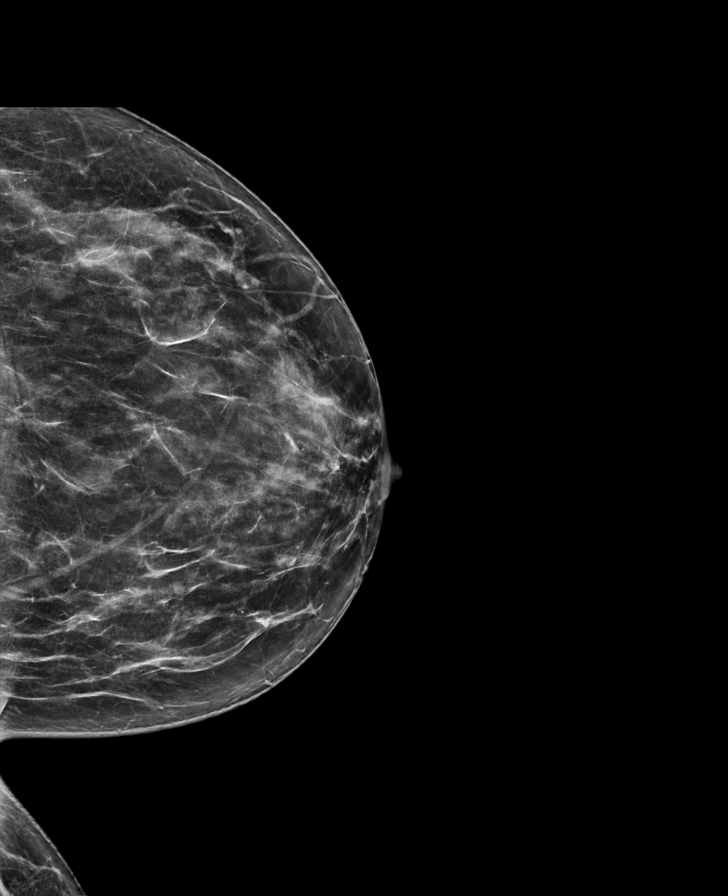

[R MLO tomo · tomo slice 38/75.0]
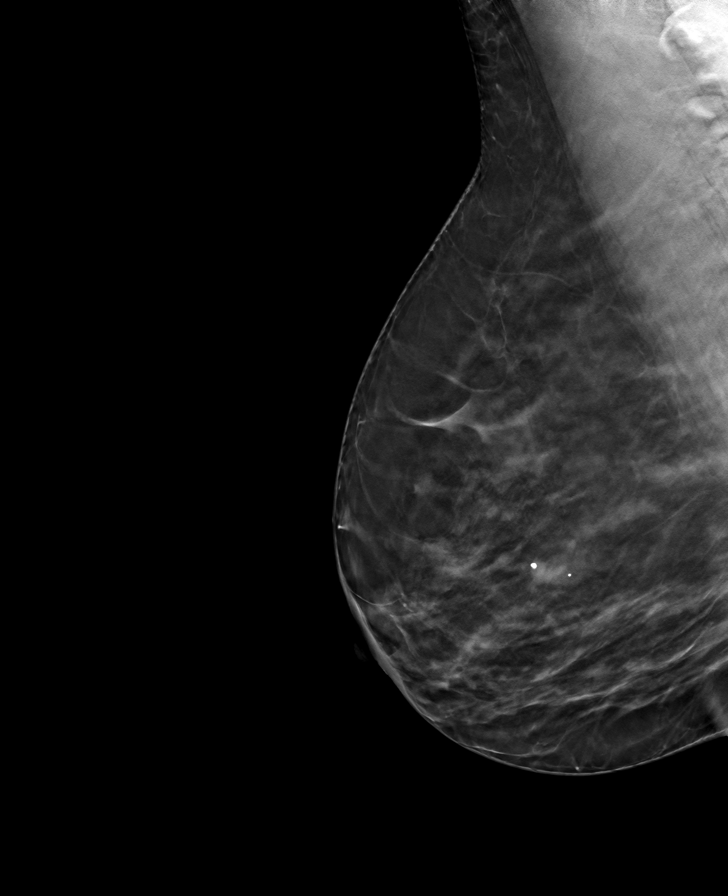

[L CC tomo · tomo slice 33/65.0]
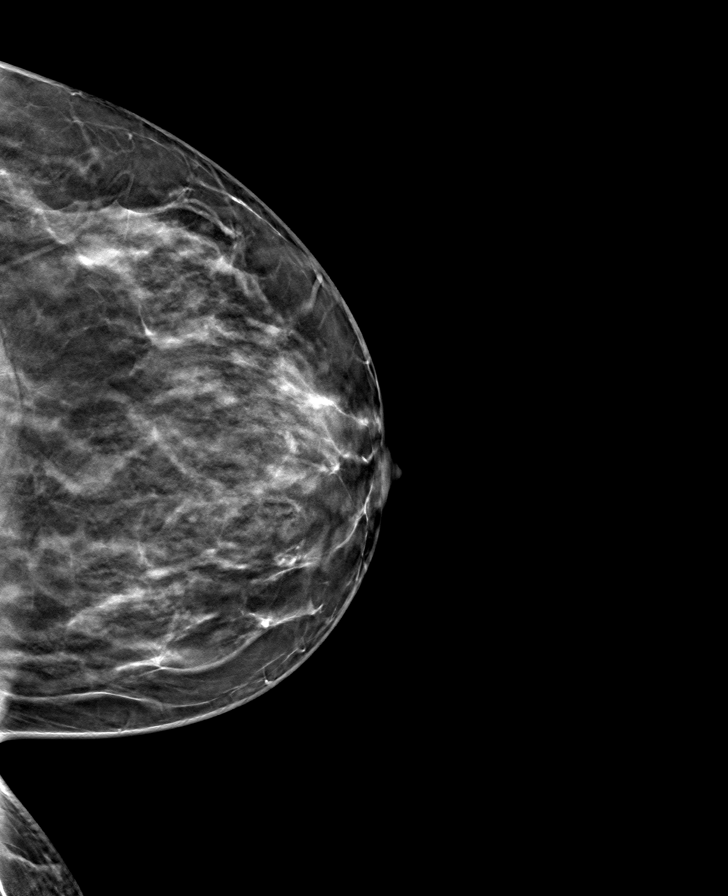

[L MLO tomo · tomo slice 35/69.0]
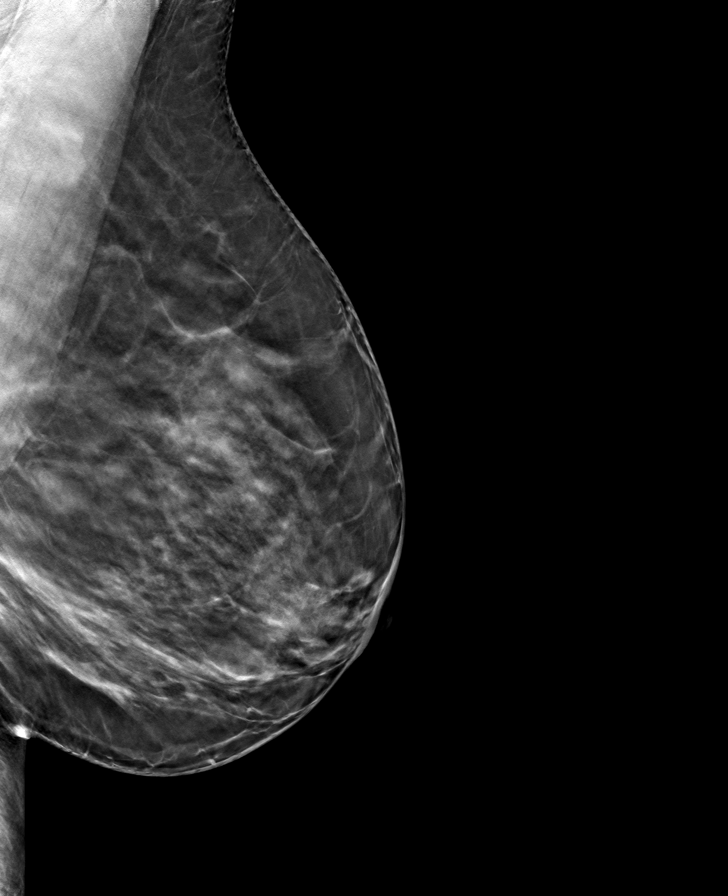

[R CC tomo · tomo slice 33/66.0]
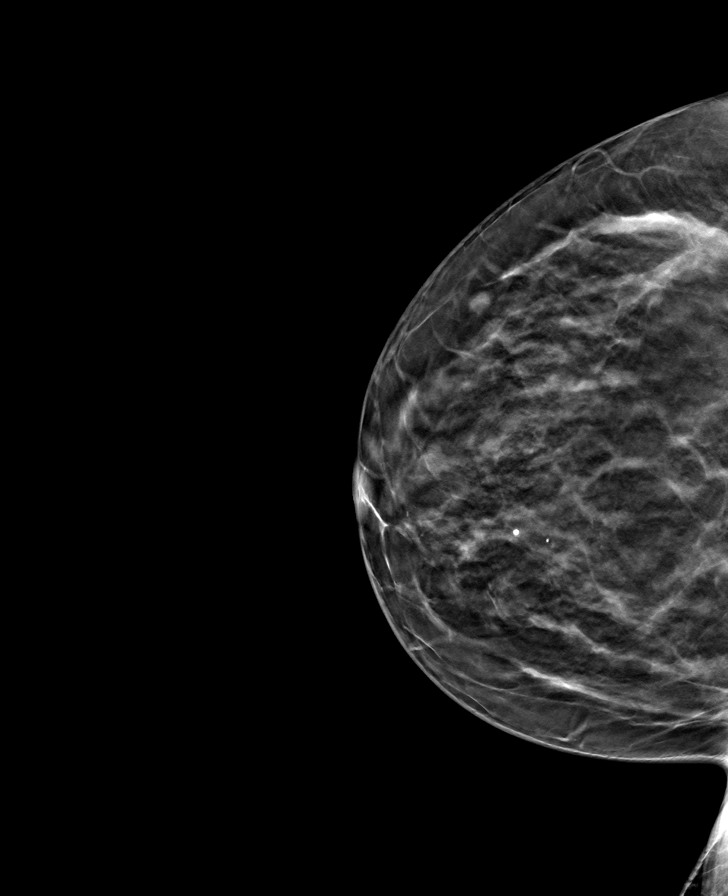

[8 of 24 positions shown; findings below may reference images not displayed]

ACR Breast Density Category c: The breast tissue is heterogeneously
dense, which may obscure small masses.
FINDINGS: In the right breast, a possible mass warrants further evaluation. In
the left breast, no findings suspicious for malignancy. Images were
processed with CAD.
IMPRESSION: Further evaluation is suggested for possible mass in the right
breast.

RECOMMENDATION:
Diagnostic mammogram and possibly ultrasound of the right breast.
(Code:[id number]-[id number])

The patient will be contacted regarding the findings, and additional
imaging will be scheduled.

BI-RADS CATEGORY  0: Incomplete. Need additional imaging evaluation
and/or prior mammograms for comparison.

## 2021-03-02 ENCOUNTER — Other Ambulatory Visit: Payer: Self-pay

## 2021-03-02 ENCOUNTER — Ambulatory Visit (INDEPENDENT_AMBULATORY_CARE_PROVIDER_SITE_OTHER): Payer: 59

## 2021-03-02 DIAGNOSIS — Z3042 Encounter for surveillance of injectable contraceptive: Secondary | ICD-10-CM | POA: Diagnosis not present

## 2021-03-02 MED ORDER — MEDROXYPROGESTERONE ACETATE 150 MG/ML IM SUSP
150.0000 mg | Freq: Once | INTRAMUSCULAR | Status: AC
  Administered 2021-03-02: 15:00:00 150 mg via INTRAMUSCULAR

## 2021-05-21 ENCOUNTER — Other Ambulatory Visit: Payer: Self-pay

## 2021-05-21 ENCOUNTER — Ambulatory Visit (INDEPENDENT_AMBULATORY_CARE_PROVIDER_SITE_OTHER): Payer: 59

## 2021-05-21 DIAGNOSIS — Z3042 Encounter for surveillance of injectable contraceptive: Secondary | ICD-10-CM | POA: Diagnosis not present

## 2021-05-21 MED ORDER — MEDROXYPROGESTERONE ACETATE 150 MG/ML IM SUSP
150.0000 mg | Freq: Once | INTRAMUSCULAR | Status: AC
  Administered 2021-05-21: 150 mg via INTRAMUSCULAR

## 2021-08-17 ENCOUNTER — Ambulatory Visit (INDEPENDENT_AMBULATORY_CARE_PROVIDER_SITE_OTHER): Payer: 59

## 2021-08-17 DIAGNOSIS — Z3042 Encounter for surveillance of injectable contraceptive: Secondary | ICD-10-CM

## 2021-08-17 MED ORDER — MEDROXYPROGESTERONE ACETATE 150 MG/ML IM SUSP
150.0000 mg | Freq: Once | INTRAMUSCULAR | Status: AC
  Administered 2021-08-17: 150 mg via INTRAMUSCULAR

## 2021-09-23 ENCOUNTER — Other Ambulatory Visit: Payer: Self-pay | Admitting: Obstetrics & Gynecology

## 2021-09-23 DIAGNOSIS — Z1231 Encounter for screening mammogram for malignant neoplasm of breast: Secondary | ICD-10-CM

## 2021-10-25 ENCOUNTER — Ambulatory Visit
Admission: RE | Admit: 2021-10-25 | Discharge: 2021-10-25 | Disposition: A | Discharge status: Home / Self Care | Payer: 59 | Source: Ambulatory Visit

## 2021-10-25 DIAGNOSIS — Z1231 Encounter for screening mammogram for malignant neoplasm of breast: Secondary | ICD-10-CM

## 2021-12-03 ENCOUNTER — Ambulatory Visit (INDEPENDENT_AMBULATORY_CARE_PROVIDER_SITE_OTHER): Payer: 59

## 2021-12-03 DIAGNOSIS — Z3042 Encounter for surveillance of injectable contraceptive: Secondary | ICD-10-CM | POA: Diagnosis not present

## 2021-12-03 MED ORDER — MEDROXYPROGESTERONE ACETATE 150 MG/ML IM SUSP
150.0000 mg | Freq: Once | INTRAMUSCULAR | Status: AC
  Administered 2021-12-03: 150 mg via INTRAMUSCULAR

## 2022-11-04 ENCOUNTER — Other Ambulatory Visit: Payer: Self-pay | Admitting: Family Medicine

## 2022-11-04 ENCOUNTER — Other Ambulatory Visit: Payer: Self-pay | Admitting: Obstetrics & Gynecology

## 2022-11-04 DIAGNOSIS — Z1231 Encounter for screening mammogram for malignant neoplasm of breast: Secondary | ICD-10-CM

## 2022-11-18 ENCOUNTER — Ambulatory Visit
Admission: RE | Admit: 2022-11-18 | Discharge: 2022-11-18 | Disposition: A | Discharge status: Home / Self Care | Payer: 59 | Source: Ambulatory Visit

## 2022-11-18 DIAGNOSIS — Z1231 Encounter for screening mammogram for malignant neoplasm of breast: Secondary | ICD-10-CM

## 2023-12-20 ENCOUNTER — Other Ambulatory Visit: Payer: Self-pay | Admitting: Family Medicine

## 2023-12-20 DIAGNOSIS — Z1231 Encounter for screening mammogram for malignant neoplasm of breast: Secondary | ICD-10-CM

## 2024-01-03 ENCOUNTER — Encounter

## 2024-01-03 DIAGNOSIS — Z1231 Encounter for screening mammogram for malignant neoplasm of breast: Secondary | ICD-10-CM

## 2024-01-15 ENCOUNTER — Other Ambulatory Visit: Payer: Self-pay | Admitting: Family Medicine

## 2024-01-15 DIAGNOSIS — Z1231 Encounter for screening mammogram for malignant neoplasm of breast: Secondary | ICD-10-CM

## 2024-01-24 ENCOUNTER — Encounter

## 2024-02-07 ENCOUNTER — Ambulatory Visit

## 2024-02-07 DIAGNOSIS — Z1231 Encounter for screening mammogram for malignant neoplasm of breast: Secondary | ICD-10-CM

## 2024-03-25 ENCOUNTER — Other Ambulatory Visit: Payer: Self-pay | Admitting: Family Medicine

## 2024-03-25 DIAGNOSIS — Z1231 Encounter for screening mammogram for malignant neoplasm of breast: Secondary | ICD-10-CM

## 2024-04-10 ENCOUNTER — Ambulatory Visit

## 2024-04-10 DIAGNOSIS — Z1231 Encounter for screening mammogram for malignant neoplasm of breast: Secondary | ICD-10-CM
